# Patient Record
Sex: Male | Born: 1955 | Race: Black or African American | Hispanic: No | Marital: Married | Smoking: Never smoker
Health system: Southern US, Community
[De-identification: ages and names within clinical notes are randomized; demographics above are authoritative.]

## PROBLEM LIST (undated history)

## (undated) DIAGNOSIS — K319 Disease of stomach and duodenum, unspecified: Secondary | ICD-10-CM

## (undated) DIAGNOSIS — I10 Essential (primary) hypertension: Secondary | ICD-10-CM

## (undated) DIAGNOSIS — E785 Hyperlipidemia, unspecified: Secondary | ICD-10-CM

## (undated) HISTORY — DX: Disease of stomach and duodenum, unspecified: K31.9

## (undated) HISTORY — PX: UPPER GASTROINTESTINAL ENDOSCOPY: SHX188

## (undated) HISTORY — PX: ESOPHAGOGASTRODUODENOSCOPY: SHX1529

## (undated) HISTORY — DX: Hyperlipidemia, unspecified: E78.5

---

## 1998-12-23 ENCOUNTER — Emergency Department (HOSPITAL_COMMUNITY): Admission: EM | Admit: 1998-12-23 | Discharge: 1998-12-23 | Payer: Self-pay | Admitting: Emergency Medicine

## 2000-08-04 ENCOUNTER — Encounter: Admission: RE | Admit: 2000-08-04 | Discharge: 2000-08-04 | Payer: Self-pay | Admitting: Family Medicine

## 2000-08-04 ENCOUNTER — Encounter: Payer: Self-pay | Admitting: Family Medicine

## 2001-03-29 ENCOUNTER — Encounter: Admission: RE | Admit: 2001-03-29 | Discharge: 2001-03-29 | Payer: Self-pay | Admitting: *Deleted

## 2001-03-29 ENCOUNTER — Encounter: Payer: Self-pay | Admitting: *Deleted

## 2005-09-07 ENCOUNTER — Emergency Department (HOSPITAL_COMMUNITY): Admission: EM | Admit: 2005-09-07 | Discharge: 2005-09-07 | Payer: Self-pay | Admitting: Emergency Medicine

## 2008-12-02 ENCOUNTER — Encounter: Admission: RE | Admit: 2008-12-02 | Discharge: 2008-12-02 | Payer: Self-pay | Admitting: Internal Medicine

## 2011-02-26 ENCOUNTER — Encounter: Payer: Self-pay | Admitting: *Deleted

## 2011-02-26 ENCOUNTER — Emergency Department (INDEPENDENT_AMBULATORY_CARE_PROVIDER_SITE_OTHER): Payer: No Typology Code available for payment source

## 2011-02-26 ENCOUNTER — Emergency Department (HOSPITAL_BASED_OUTPATIENT_CLINIC_OR_DEPARTMENT_OTHER)
Admission: EM | Admit: 2011-02-26 | Discharge: 2011-02-26 | Disposition: A | Discharge status: Home / Self Care | Payer: No Typology Code available for payment source | Attending: Emergency Medicine | Admitting: Emergency Medicine

## 2011-02-26 DIAGNOSIS — S39012A Strain of muscle, fascia and tendon of lower back, initial encounter: Secondary | ICD-10-CM

## 2011-02-26 DIAGNOSIS — M542 Cervicalgia: Secondary | ICD-10-CM

## 2011-02-26 DIAGNOSIS — M545 Low back pain: Secondary | ICD-10-CM

## 2011-02-26 DIAGNOSIS — Y92410 Unspecified street and highway as the place of occurrence of the external cause: Secondary | ICD-10-CM | POA: Insufficient documentation

## 2011-02-26 DIAGNOSIS — S335XXA Sprain of ligaments of lumbar spine, initial encounter: Secondary | ICD-10-CM | POA: Insufficient documentation

## 2011-02-26 DIAGNOSIS — I10 Essential (primary) hypertension: Secondary | ICD-10-CM | POA: Insufficient documentation

## 2011-02-26 HISTORY — DX: Essential (primary) hypertension: I10

## 2011-02-26 MED ORDER — HYDROCODONE-ACETAMINOPHEN 5-325 MG PO TABS: 1.0000 | ORAL_TABLET | Freq: Four times a day (QID) | ORAL | Status: AC | PRN

## 2011-02-26 MED ORDER — CYCLOBENZAPRINE HCL 5 MG PO TABS: 5.0000 mg | ORAL_TABLET | Freq: Three times a day (TID) | ORAL | Status: AC | PRN
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
## 2011-09-21 ENCOUNTER — Encounter: Payer: Self-pay | Admitting: Internal Medicine

## 2011-12-05 ENCOUNTER — Encounter: Payer: BC Managed Care – PPO | Admitting: Internal Medicine

## 2013-03-19 ENCOUNTER — Encounter: Payer: Self-pay | Admitting: Internal Medicine

## 2013-05-23 ENCOUNTER — Encounter: Payer: BC Managed Care – PPO | Admitting: Internal Medicine

## 2013-07-30 ENCOUNTER — Encounter: Payer: Self-pay | Admitting: Family Medicine

## 2013-07-30 ENCOUNTER — Telehealth: Payer: Self-pay | Admitting: Family Medicine

## 2013-07-30 ENCOUNTER — Encounter: Payer: Self-pay | Admitting: Internal Medicine

## 2013-08-09 NOTE — Telephone Encounter (Signed)
error 

## 2014-04-11 HISTORY — PX: COLONOSCOPY: SHX174

## 2014-04-28 ENCOUNTER — Encounter: Payer: Self-pay | Admitting: Gastroenterology

## 2014-05-22 ENCOUNTER — Telehealth: Payer: Self-pay | Admitting: Internal Medicine

## 2014-05-22 NOTE — Telephone Encounter (Signed)
Received records from [facility] (Dr Bernerd Limbo) for appointment on 06/13/14 with Dr Debara Pickett.  Records given to [facility] (medical records) for Dr Lysbeth Penner schedule on 06/13/14. lp

## 2014-06-11 ENCOUNTER — Ambulatory Visit (AMBULATORY_SURGERY_CENTER): Payer: Self-pay

## 2014-06-11 VITALS — Ht 70.5 in | Wt 265.0 lb

## 2014-06-11 DIAGNOSIS — Z1211 Encounter for screening for malignant neoplasm of colon: Secondary | ICD-10-CM

## 2014-06-11 MED ORDER — SUPREP BOWEL PREP KIT 17.5-3.13-1.6 GM/177ML PO SOLN: 1.0000 | Freq: Once | ORAL | Status: DC

## 2014-06-11 NOTE — Progress Notes (Signed)
No allergies to eggs or soy No past problems with anesthesia No home oxygen No diet/weight loss meds  Has email  Emmi instructions given for colonoscopy 

## 2014-06-13 ENCOUNTER — Encounter: Payer: Self-pay | Admitting: Internal Medicine

## 2014-06-13 ENCOUNTER — Ambulatory Visit (INDEPENDENT_AMBULATORY_CARE_PROVIDER_SITE_OTHER): Payer: BC Managed Care – PPO | Admitting: Internal Medicine

## 2014-06-13 VITALS — BP 168/118 | HR 52 | Ht 70.5 in | Wt 266.9 lb

## 2014-06-13 DIAGNOSIS — R0789 Other chest pain: Secondary | ICD-10-CM

## 2014-06-13 DIAGNOSIS — I10 Essential (primary) hypertension: Secondary | ICD-10-CM

## 2014-06-13 DIAGNOSIS — E785 Hyperlipidemia, unspecified: Secondary | ICD-10-CM

## 2014-06-13 NOTE — Patient Instructions (Signed)
Your physician recommends that you schedule a follow-up appointment as needed  
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
## 2014-06-25 ENCOUNTER — Ambulatory Visit (AMBULATORY_SURGERY_CENTER): Payer: BC Managed Care – PPO | Admitting: Gastroenterology

## 2014-06-25 ENCOUNTER — Encounter: Payer: Self-pay | Admitting: Gastroenterology

## 2014-06-25 VITALS — BP 161/88 | HR 56 | Temp 97.9°F | Resp 16 | Ht 70.0 in | Wt 265.0 lb

## 2014-06-25 DIAGNOSIS — D122 Benign neoplasm of ascending colon: Secondary | ICD-10-CM

## 2014-06-25 DIAGNOSIS — D124 Benign neoplasm of descending colon: Secondary | ICD-10-CM | POA: Diagnosis not present

## 2014-06-25 DIAGNOSIS — Z1211 Encounter for screening for malignant neoplasm of colon: Secondary | ICD-10-CM

## 2014-06-25 DIAGNOSIS — K5730 Diverticulosis of large intestine without perforation or abscess without bleeding: Secondary | ICD-10-CM

## 2014-06-25 MED ORDER — SODIUM CHLORIDE 0.9 % IV SOLN: 500.0000 mL | INTRAVENOUS | Status: DC

## 2014-06-25 NOTE — Progress Notes (Signed)
Called to room to assist during endoscopic procedure.  Patient ID and intended procedure confirmed with present staff. Received instructions for my participation in the procedure from the performing physician.  

## 2014-06-25 NOTE — Op Note (Signed)
[place]  [address] [place], [id number]   COLONOSCOPY PROCEDURE REPORT  PATIENT: Ronnie Salinas, Ronnie Salinas  MR#: [id number] BIRTHDATE: 1956-01-29 , 33  yrs. old GENDER: male ENDOSCOPIST: Inda Castle, MD REFERRED RN:HAFBX Bouska, M.D. PROCEDURE DATE:  06/25/2014 PROCEDURE:   Colonoscopy with cold biopsy polypectomy, Colonoscopy with snare polypectomy, and Colonoscopy, screening First Screening Colonoscopy - Avg.  risk and is 50 yrs.  old or older Yes.  Prior Negative Screening - Now for repeat screening. N/A  History of Adenoma - Now for follow-up colonoscopy & has been > or = to 3 yrs.  N/A ASA CLASS:   Class II INDICATIONS:Colorectal Neoplasm Risk Assessment for this procedure is average risk. MEDICATIONS: Monitored anesthesia care and Propofol 200 mg IV  DESCRIPTION OF PROCEDURE:   After the risks benefits and alternatives of the procedure were thoroughly explained, informed consent was obtained.  The digital rectal exam revealed no abnormalities of the rectum.   The LB [id number]  endoscope was introduced through the anus and advanced to the cecum, which was identified by both the appendix and ileocecal valve. No adverse events experienced.   The quality of the prep was (Suprep was used) excellent.  The instrument was then slowly withdrawn as the colon was fully examined.      COLON FINDINGS: A sessile polyp measuring 3 mm in size was found in the ascending colon.  A polypectomy was performed with a cold snare.  The resection was complete, the polyp tissue was completely retrieved and sent to histology.   There was mild diverticulosis noted in the ascending colon.   A sessile polyp measuring 2 mm in size was found in the descending colon.  A polypectomy was performed with cold forceps.  Retroflexed views revealed no abnormalities. The time to cecum = 2.6 Withdrawal time = 13.5   The scope was withdrawn and the procedure completed. COMPLICATIONS:  There were no immediate complications.  ENDOSCOPIC IMPRESSION: 1.   Sessile polyp was found in the ascending colon; polypectomy was performed with a cold snare 2.   Mild diverticulosis was noted in the ascending colon 3.   Sessile polyp was found in the descending colon; polypectomy was performed with cold forceps  RECOMMENDATIONS: If the polyp(s) removed today are proven to be adenomatous (pre-cancerous) polyps, you will need a repeat colonoscopy in 5 years.  Otherwise you should continue to follow colorectal cancer screening guidelines for "routine risk" patients with colonoscopy in 10 years.  You will receive a letter within 1-2 weeks with the results of your biopsy as well as final recommendations.  Please call my office if you have not received a letter after 3 weeks.  eSigned:  Inda Castle, MD 06/25/2014 10:57 AM   cc:   PATIENT NAME:  Ronnie Salinas, Ronnie Salinas MR#: [id number]

## 2014-06-25 NOTE — Patient Instructions (Signed)
YOU HAD AN ENDOSCOPIC PROCEDURE TODAY AT THE [facility]:   Refer to the procedure report that was given to you for any specific questions about what was found during the examination.  If the procedure report does not answer your questions, please call your gastroenterologist to clarify.  If you requested that your care partner not be given the details of your procedure findings, then the procedure report has been included in a sealed envelope for you to review at your convenience later.  YOU SHOULD EXPECT: Some feelings of bloating in the abdomen. Passage of more gas than usual.  Walking can help get rid of the air that was put into your GI tract during the procedure and reduce the bloating. If you had a lower endoscopy (such as a colonoscopy or flexible sigmoidoscopy) you may notice spotting of blood in your stool or on the toilet paper. If you underwent a bowel prep for your procedure, you may not have a normal bowel movement for a few days.  Please Note:  You might notice some irritation and congestion in your nose or some drainage.  This is from the oxygen used during your procedure.  There is no need for concern and it should clear up in a day or so.  SYMPTOMS TO REPORT IMMEDIATELY:   Following lower endoscopy (colonoscopy or flexible sigmoidoscopy):  Excessive amounts of blood in the stool  Significant tenderness or worsening of abdominal pains  Swelling of the abdomen that is new, acute  Fever of 100F or higher   For urgent or emergent issues, a gastroenterologist can be reached at any hour by calling [phone].   DIET: Your first meal following the procedure should be a small meal and then it is ok to progress to your normal diet. Heavy or fried foods are harder to digest and may make you feel nauseous or bloated.  Likewise, meals heavy in dairy and vegetables can increase bloating.  Drink plenty of fluids but you should avoid alcoholic beverages for 24  hours.  ACTIVITY:  You should plan to take it easy for the rest of today and you should NOT DRIVE or use heavy machinery until tomorrow (because of the sedation medicines used during the test).    FOLLOW UP: Our staff will call the number listed on your records the next business day following your procedure to check on you and address any questions or concerns that you may have regarding the information given to you following your procedure. If we do not reach you, we will leave a message.  However, if you are feeling well and you are not experiencing any problems, there is no need to return our call.  We will assume that you have returned to your regular daily activities without incident.  If any biopsies were taken you will be contacted by phone or by letter within the next 1-3 weeks.  Please call us at [phone] if you have not heard about the biopsies in 3 weeks.    SIGNATURES/CONFIDENTIALITY: You and/or your care partner have signed paperwork which will be entered into your electronic medical record.  These signatures attest to the fact that that the information above on your After Visit Summary has been reviewed and is understood.  Full responsibility of the confidentiality of this discharge information lies with you and/or your care-partner.  Recommendations Next colonoscopy determined by pathology results; 5 or 10 years. Discharge instructions given to patient and/or care partner. Polyp, diverticulosis, and high fiber   diet handouts provided.  

## 2014-06-26 ENCOUNTER — Telehealth: Payer: Self-pay

## 2014-06-26 NOTE — Telephone Encounter (Signed)
No answer, left message

## 2014-07-01 ENCOUNTER — Encounter: Payer: Self-pay | Admitting: Gastroenterology

## 2014-07-02 ENCOUNTER — Telehealth: Payer: Self-pay | Admitting: Gastroenterology

## 2014-07-02 NOTE — Telephone Encounter (Signed)
Patient vomited once on Sunday. Then he started having watery bowel movements. He is have "20" stools a day. Everything he eats goes straight through him. Appetite is good. No further nausea. No fevers. Please advise.

## 2014-07-02 NOTE — Telephone Encounter (Signed)
Probable gastroenteritis.  Make sure that he maintains his fluids.  Call back later in week if he still is ill

## 2014-07-02 NOTE — Telephone Encounter (Signed)
Patient instructed on clear liquids and to advance as tolerated to maintain hydration. May use OTC supportive care such as Imodium and baby diaper wipes.

## 2019-08-13 ENCOUNTER — Encounter: Payer: BC Managed Care – PPO | Admitting: Gastroenterology

## 2019-08-13 ENCOUNTER — Ambulatory Visit (AMBULATORY_SURGERY_CENTER): Payer: Self-pay

## 2019-08-13 ENCOUNTER — Other Ambulatory Visit: Payer: Self-pay

## 2019-08-13 VITALS — Temp 97.6°F | Ht 70.0 in | Wt 257.0 lb

## 2019-08-13 DIAGNOSIS — Z86010 Personal history of colonic polyps: Secondary | ICD-10-CM

## 2019-08-13 MED ORDER — CLENPIQ 10-3.5-12 MG-GM -GM/160ML PO SOLN: 1.0000 | Freq: Once | ORAL | 0 refills | Status: AC

## 2019-08-13 NOTE — Progress Notes (Signed)
No egg or soy allergy known to patient  No issues with past sedation with any surgeries  or procedures, no intubation problems  No diet pills per patient No home 02 use per patient  No blood thinners per patient  Pt denies issues with constipation  No A fib or A flutter  EMMI video sent to pt's e mail   Pt has completed his covid vaccine series.  clenpiq coupon given  Due to the COVID-19 pandemic we are asking patients to follow these guidelines. Please only bring one care partner. Please be aware that your care partner may wait in the car in the parking lot or if they feel like they will be too hot to wait in the car, they may wait in the lobby on the 4th floor. All care partners are required to wear a mask the entire time (we do not have any that we can provide them), they need to practice social distancing, and we will do a Covid check for all patient's and care partners when you arrive. Also we will check their temperature and your temperature. If the care partner waits in their car they need to stay in the parking lot the entire time and we will call them on their cell phone when the patient is ready for discharge so they can bring the car to the front of the building. Also all patient's will need to wear a mask into building.

## 2019-08-21 ENCOUNTER — Encounter: Payer: Self-pay | Admitting: Gastroenterology

## 2019-08-23 ENCOUNTER — Encounter: Payer: Self-pay | Admitting: Gastroenterology

## 2019-08-23 ENCOUNTER — Other Ambulatory Visit: Payer: Self-pay

## 2019-08-23 ENCOUNTER — Ambulatory Visit (AMBULATORY_SURGERY_CENTER): Payer: BC Managed Care – PPO | Admitting: Gastroenterology

## 2019-08-23 VITALS — BP 147/88 | HR 47 | Temp 97.1°F | Resp 21 | Ht 70.0 in | Wt 257.0 lb

## 2019-08-23 DIAGNOSIS — K635 Polyp of colon: Secondary | ICD-10-CM

## 2019-08-23 DIAGNOSIS — K640 First degree hemorrhoids: Secondary | ICD-10-CM

## 2019-08-23 DIAGNOSIS — K639 Disease of intestine, unspecified: Secondary | ICD-10-CM | POA: Diagnosis not present

## 2019-08-23 DIAGNOSIS — K5730 Diverticulosis of large intestine without perforation or abscess without bleeding: Secondary | ICD-10-CM

## 2019-08-23 DIAGNOSIS — Z86010 Personal history of colonic polyps: Secondary | ICD-10-CM

## 2019-08-23 DIAGNOSIS — D122 Benign neoplasm of ascending colon: Secondary | ICD-10-CM

## 2019-08-23 MED ORDER — SODIUM CHLORIDE 0.9 % IV SOLN
500.0000 mL | Freq: Once | INTRAVENOUS | Status: DC
Start: 2019-08-23 — End: 2019-08-23

## 2019-08-23 NOTE — Op Note (Addendum)
[facility] Patient Name: Ronnie Salinas Procedure Date: 08/23/2019 11:44 AM MRN: [id number] Endoscopist: Gerrit Heck , MD Age: 65 Referring MD:  Date of Birth: Dec 16, 1955 Gender: Male Account #: [id number] Procedure:                Colonoscopy Indications:              Surveillance: Personal history of adenomatous                            polyps on last colonoscopy 5 years ago                           - Colonoscopy in 06/2014 with 2 small (2-3 mm)                            tubular adenomas, with recommendation to repeat in                            5 years. Otherwise, no active GI symptoms. Medicines:                Monitored Anesthesia Care Procedure:                Pre-Anesthesia Assessment:                           - Prior to the procedure, a History and Physical                            was performed, and patient medications and                            allergies were reviewed. The patient's tolerance of                            previous anesthesia was also reviewed. The risks                            and benefits of the procedure and the sedation                            options and risks were discussed with the patient.                            All questions were answered, and informed consent                            was obtained. Prior Anticoagulants: The patient has                            taken no previous anticoagulant or antiplatelet                            agents. ASA Grade Assessment: II - A patient with  mild systemic disease. After reviewing the risks                            and benefits, the patient was deemed in                            satisfactory condition to undergo the procedure.                           After obtaining informed consent, the colonoscope                            was passed under direct vision. Throughout the                            procedure, the patient's blood pressure,  pulse, and                            oxygen saturations were monitored continuously. The                            Colonoscope was introduced through the anus and                            advanced to the the terminal ileum. The colonoscopy                            was performed without difficulty. The patient                            tolerated the procedure well. The quality of the                            bowel preparation was excellent. The terminal                            ileum, ileocecal valve, appendiceal orifice, and                            rectum were photographed. Scope In: 11:54:02 AM Scope Out: 12:08:57 PM Scope Withdrawal Time: 0 hours 12 minutes 36 seconds  Total Procedure Duration: 0 hours 14 minutes 55 seconds  Findings:                 The perianal and digital rectal examinations were                            normal.                           A 4 mm polyp was found in the ascending colon. The                            polyp was sessile. The polyp was removed with a  cold snare. Resection and retrieval were complete.                            Estimated blood loss was minimal.                           A few small-mouthed diverticula were found in the                            sigmoid colon.                           Non-bleeding internal hemorrhoids were found during                            retroflexion. The hemorrhoids were small.                           The exam was otherwise normal throughout the                            remainder of the colon.                           There was a submucosal nodule, approximately 8 mm                            in size, in the terminal ileum, located 5 cm from                            the ileocecal valve. The lesion was firm and                            semi-mobile. Tunnelled biopsies were taken with a                            cold forceps for histology. Estimated blood loss                             was minimal. Complications:            No immediate complications. Estimated Blood Loss:     Estimated blood loss was minimal. Impression:               - One 4 mm polyp in the ascending colon, removed                            with a cold snare. Resected and retrieved.                           - Diverticulosis in the sigmoid colon.                           - Non-bleeding internal hemorrhoids.                           -  Subepithelial nodule located in the distal ileum,                            located approximately 5 cm from the ileocecal                            valve. Biopsied. Recommendation:           - Patient has a contact number available for                            emergencies. The signs and symptoms of potential                            delayed complications were discussed with the                            patient. Return to normal activities tomorrow.                            Written discharge instructions were provided to the                            patient.                           - Resume previous diet.                           - Continue present medications.                           - Await pathology results.                           - Repeat colonoscopy for surveillance based on                            pathology results.                           - Return to GI office at appointment to be                            scheduled.                           - Will discuss whether further evaluation is needed                            for the ileal nodule based on pathology. Gerrit Heck, MD 08/23/2019 12:21:15 PM

## 2019-08-23 NOTE — Patient Instructions (Signed)
Handouts Provided:  Polyps, Diverticulosis, and Hemorrhoids  YOU HAD AN ENDOSCOPIC PROCEDURE TODAY AT THE [facility]:   Refer to the procedure report that was given to you for any specific questions about what was found during the examination.  If the procedure report does not answer your questions, please call your gastroenterologist to clarify.  If you requested that your care partner not be given the details of your procedure findings, then the procedure report has been included in a sealed envelope for you to review at your convenience later.  YOU SHOULD EXPECT: Some feelings of bloating in the abdomen. Passage of more gas than usual.  Walking can help get rid of the air that was put into your GI tract during the procedure and reduce the bloating. If you had a lower endoscopy (such as a colonoscopy or flexible sigmoidoscopy) you may notice spotting of blood in your stool or on the toilet paper. If you underwent a bowel prep for your procedure, you may not have a normal bowel movement for a few days.  Please Note:  You might notice some irritation and congestion in your nose or some drainage.  This is from the oxygen used during your procedure.  There is no need for concern and it should clear up in a day or so.  SYMPTOMS TO REPORT IMMEDIATELY:   Following lower endoscopy (colonoscopy or flexible sigmoidoscopy):  Excessive amounts of blood in the stool  Significant tenderness or worsening of abdominal pains  Swelling of the abdomen that is new, acute  Fever of 100F or higher   For urgent or emergent issues, a gastroenterologist can be reached at any hour by calling [phone]. Do not use MyChart messaging for urgent concerns.    DIET:  We do recommend a small meal at first, but then you may proceed to your regular diet.  Drink plenty of fluids but you should avoid alcoholic beverages for 24 hours.  ACTIVITY:  You should plan to take it easy for the rest of today and you  should NOT DRIVE or use heavy machinery until tomorrow (because of the sedation medicines used during the test).    FOLLOW UP: Our staff will call the number listed on your records 48-72 hours following your procedure to check on you and address any questions or concerns that you may have regarding the information given to you following your procedure. If we do not reach you, we will leave a message.  We will attempt to reach you two times.  During this call, we will ask if you have developed any symptoms of COVID 19. If you develop any symptoms (ie: fever, flu-like symptoms, shortness of breath, cough etc.) before then, please call [phone].  If you test positive for Covid 19 in the 2 weeks post procedure, please call and report this information to us.    If any biopsies were taken you will be contacted by phone or by letter within the next 1-3 weeks.  Please call us at [phone] if you have not heard about the biopsies in 3 weeks.    SIGNATURES/CONFIDENTIALITY: You and/or your care partner have signed paperwork which will be entered into your electronic medical record.  These signatures attest to the fact that that the information above on your After Visit Summary has been reviewed and is understood.  Full responsibility of the confidentiality of this discharge information lies with you and/or your care-partner.  

## 2019-08-23 NOTE — Progress Notes (Signed)
Called to room to assist during endoscopic procedure.  Patient ID and intended procedure confirmed with present staff. Received instructions for my participation in the procedure from the performing physician.  

## 2019-08-23 NOTE — Progress Notes (Signed)
KA- vitals LS- temp

## 2019-08-23 NOTE — Progress Notes (Signed)
Report to PACU, RN, vss, BBS= Clear.  

## 2019-08-23 NOTE — Progress Notes (Signed)
Pt's states no medical or surgical changes since previsit or office visit. 

## 2019-08-27 ENCOUNTER — Telehealth: Payer: Self-pay

## 2019-08-27 NOTE — Telephone Encounter (Signed)
1st follow up call made.  NALM 

## 2019-08-27 NOTE — Telephone Encounter (Signed)
2nd follow up call made.  NALM 

## 2019-08-29 ENCOUNTER — Encounter: Payer: Self-pay | Admitting: Gastroenterology

## 2019-09-25 ENCOUNTER — Other Ambulatory Visit: Payer: Self-pay | Admitting: Gastroenterology

## 2019-09-25 ENCOUNTER — Telehealth: Payer: Self-pay

## 2019-09-25 DIAGNOSIS — K639 Disease of intestine, unspecified: Secondary | ICD-10-CM

## 2019-09-25 NOTE — Telephone Encounter (Signed)
-----   Message from Lavena Bullion, DO sent at 09/24/2019 10:52 AM EDT ----- Can you please get a follow-up appt for this patient? He has a small nodule in the distal small bowel seen on colonoscopy. The biopsies were benign, but I think this needs to evaluated further with CT abdomen/pelvis with PO/IV contrast (I wrote all that in the biopsy letter to him). Would be ok to do CT first, then have f/u appt to discuss results and to discuss when repeat colonoscopy needed. Thanks.

## 2019-09-25 NOTE — Telephone Encounter (Signed)
Spoke to patient who is scheduled for a CT then will follow up in office to discuss results. He will come by the office to pick up contrast then have his labs drawn for the CT at that time. All questions answered. Patient voiced understanding.

## 2019-10-23 ENCOUNTER — Ambulatory Visit (HOSPITAL_BASED_OUTPATIENT_CLINIC_OR_DEPARTMENT_OTHER): Admission: RE | Admit: 2019-10-23 | Payer: BC Managed Care – PPO | Source: Ambulatory Visit

## 2019-10-23 ENCOUNTER — Other Ambulatory Visit: Payer: Self-pay

## 2019-10-23 DIAGNOSIS — K639 Disease of intestine, unspecified: Secondary | ICD-10-CM

## 2019-10-24 LAB — BUN+CREAT
BUN/Creatinine Ratio: 13 (ref 10–24)
BUN: 15 mg/dL (ref 8–27)
Creatinine, Ser: 1.2 mg/dL (ref 0.76–1.27)
GFR calc Af Amer: 75 mL/min/{1.73_m2} (ref >59)
GFR calc non Af Amer: 65 mL/min/{1.73_m2} (ref >59)

## 2019-10-31 ENCOUNTER — Other Ambulatory Visit: Payer: Self-pay

## 2019-10-31 ENCOUNTER — Encounter (HOSPITAL_BASED_OUTPATIENT_CLINIC_OR_DEPARTMENT_OTHER): Payer: Self-pay

## 2019-10-31 ENCOUNTER — Ambulatory Visit (HOSPITAL_BASED_OUTPATIENT_CLINIC_OR_DEPARTMENT_OTHER)
Admission: RE | Admit: 2019-10-31 | Discharge: 2019-10-31 | Disposition: A | Discharge status: Home / Self Care | Payer: BC Managed Care – PPO | Source: Ambulatory Visit | Attending: Gastroenterology | Admitting: Gastroenterology

## 2019-10-31 DIAGNOSIS — K639 Disease of intestine, unspecified: Secondary | ICD-10-CM | POA: Diagnosis not present

## 2019-10-31 MED ORDER — IOHEXOL 300 MG/ML  SOLN
100.0000 mL | Freq: Once | INTRAMUSCULAR | Status: AC | PRN
  Administered 2019-10-31: 100 mL via INTRAVENOUS

## 2019-11-19 ENCOUNTER — Telehealth: Payer: Self-pay | Admitting: Gastroenterology

## 2019-11-19 NOTE — Telephone Encounter (Signed)
Spoke to patient who has agreed to keep his appointment on 11/21/19

## 2019-11-21 ENCOUNTER — Ambulatory Visit: Payer: BC Managed Care – PPO | Admitting: Gastroenterology

## 2021-08-18 IMAGING — CT CT ABD-PELV W/ CM
2 of 8 series · 15 of 46 positions shown, 17 images · IV contrast (Omnipaque)
Comparison: None

CLINICAL DATA: Patient reports small nodule and distal small bowel
noted on colonoscopy. Biopsies of nodule were benign.

EXAM:
CT ABDOMEN AND PELVIS WITH CONTRAST
TECHNIQUE: Multidetector CT imaging of the abdomen and pelvis was performed
using the standard protocol following bolus administration of
intravenous contrast.
CONTRAST:  100mL OMNIPAQUE IOHEXOL 300 MG/ML  SOLN

[Series 2: axial st · axial · 0.98mm/px · z∈[-518,-23]mm · 12 of 113 slices shown, 14 images]
[im 7/113  soft-tissue]
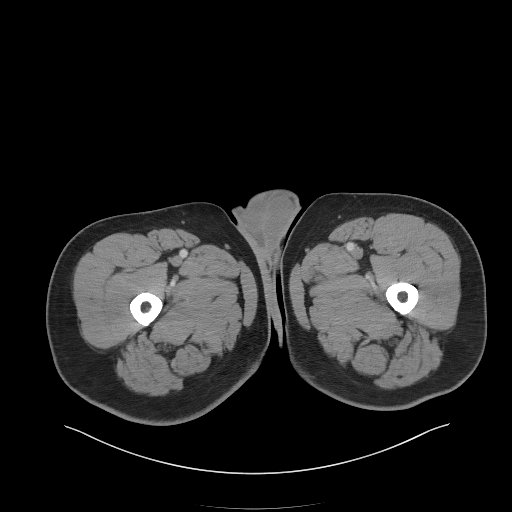
[im 7/113  bone]
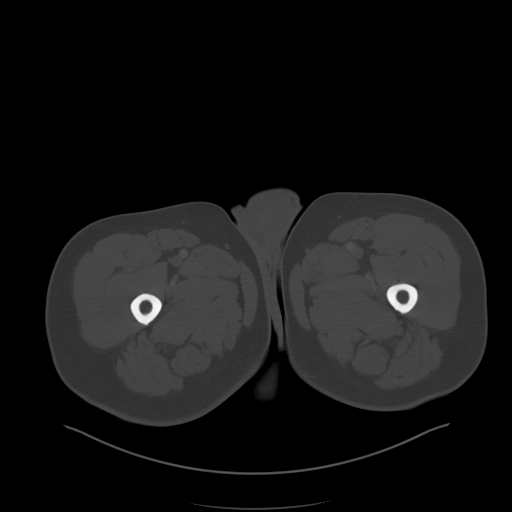
[im 19/113  soft-tissue]
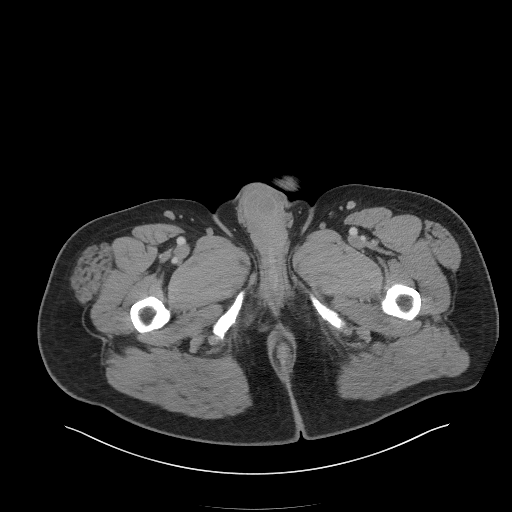
[im 25/113  soft-tissue]
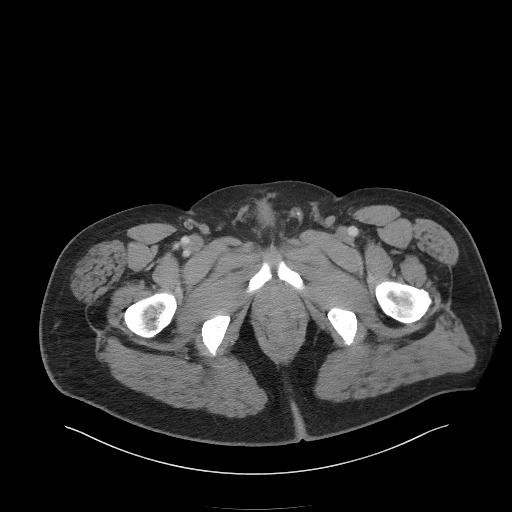
[im 32/113  soft-tissue]
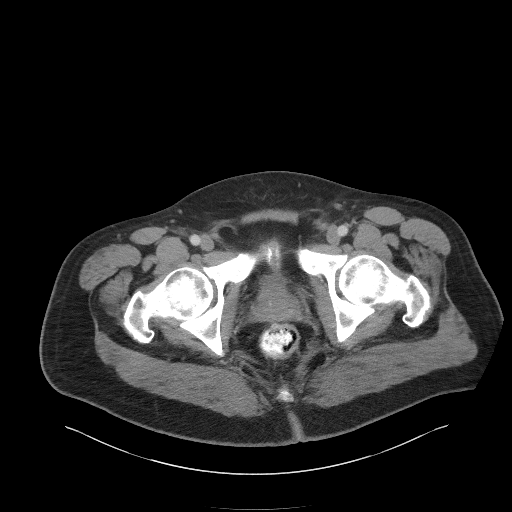
[im 44/113  soft-tissue]
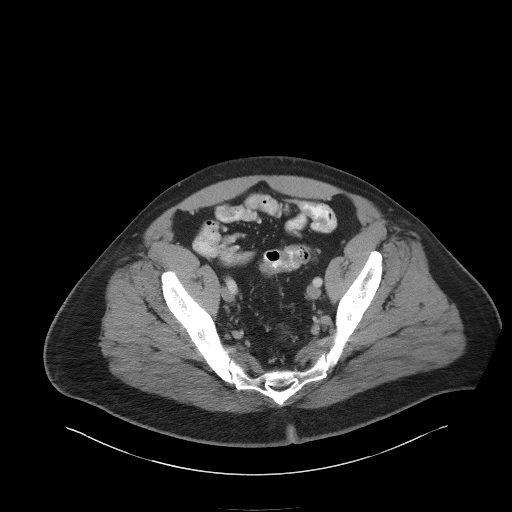
[im 50/113  soft-tissue]
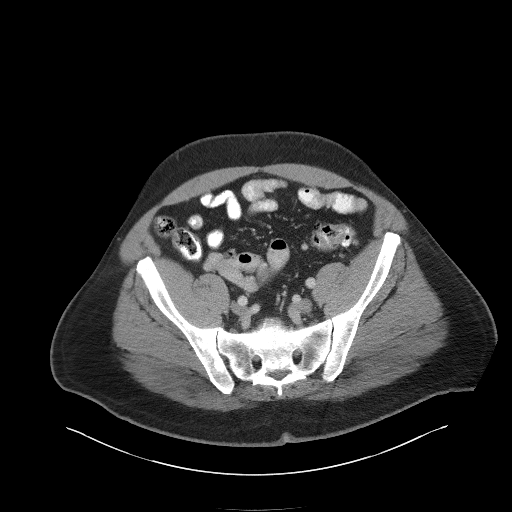
[im 63/113  soft-tissue]
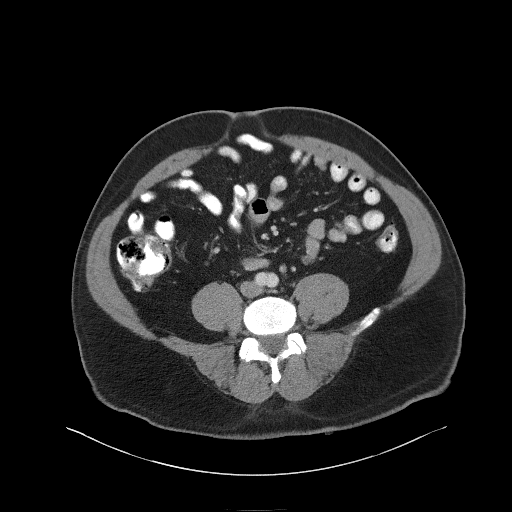
[im 69/113  soft-tissue]
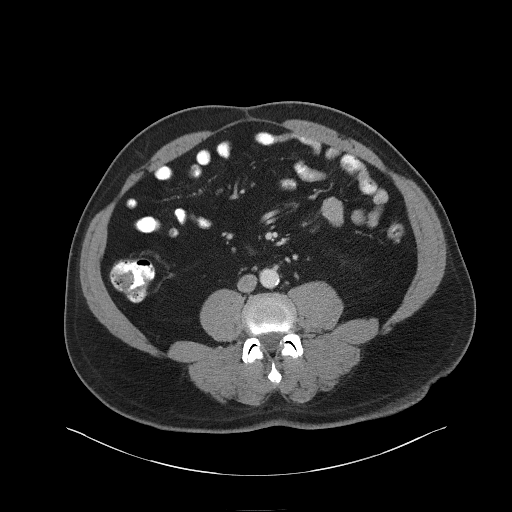
[im 81/113  soft-tissue]
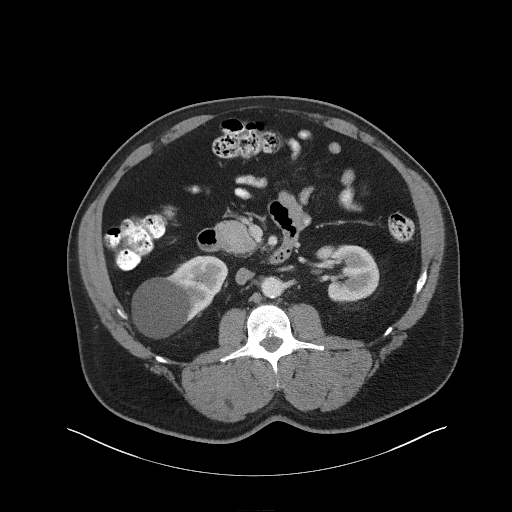
[im 81/113  bone]
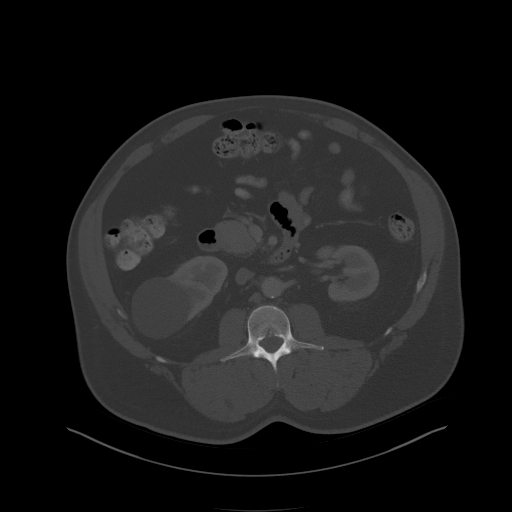
[im 88/113  soft-tissue]
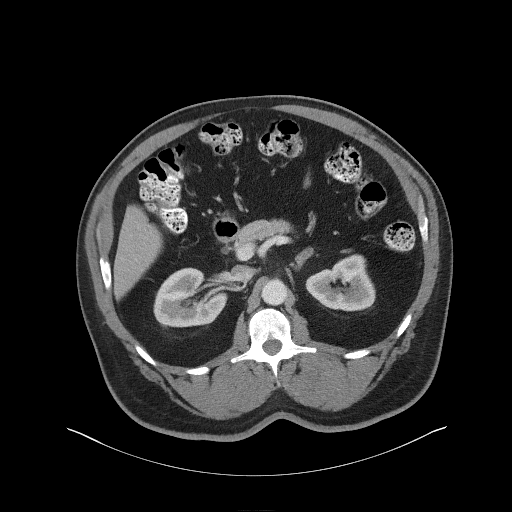
[im 94/113  soft-tissue]
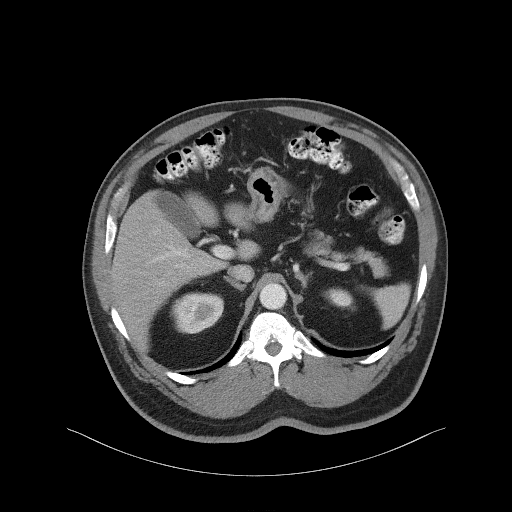
[im 106/113  soft-tissue]
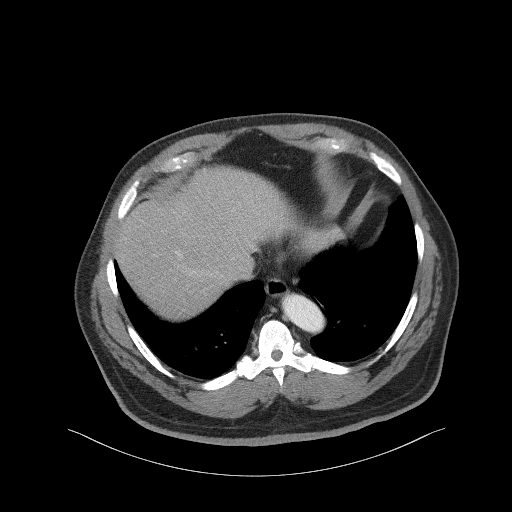

[Series 5: coronal st · coronal · 0.88mm/px · 3 of 117 slices shown]
[im 30/117  soft-tissue]
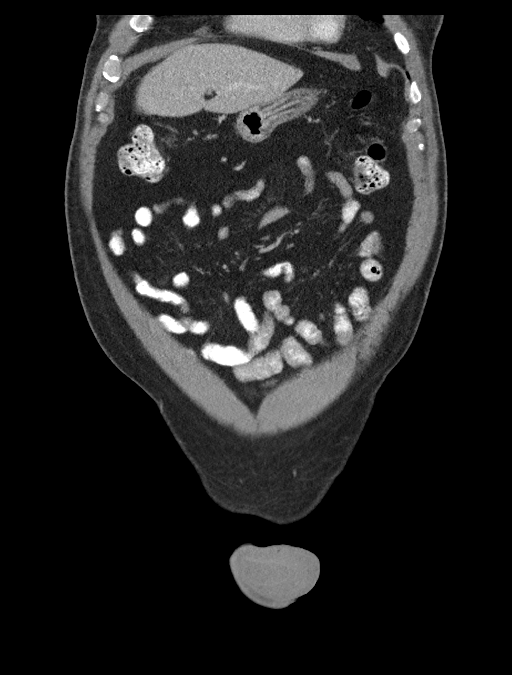
[im 59/117  soft-tissue]
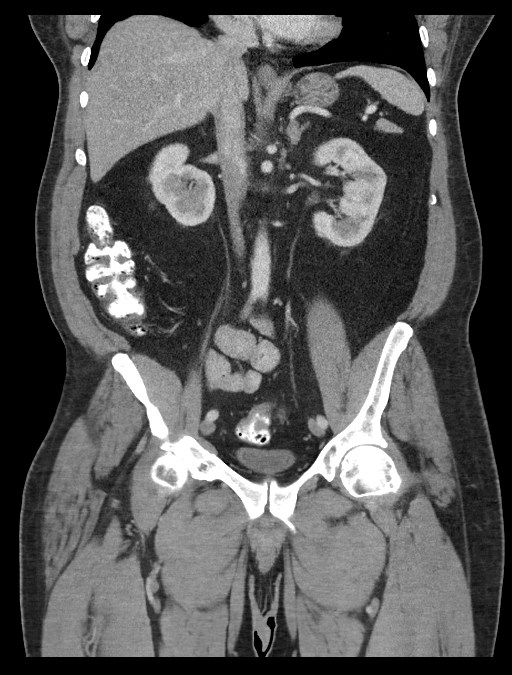
[im 88/117  soft-tissue]
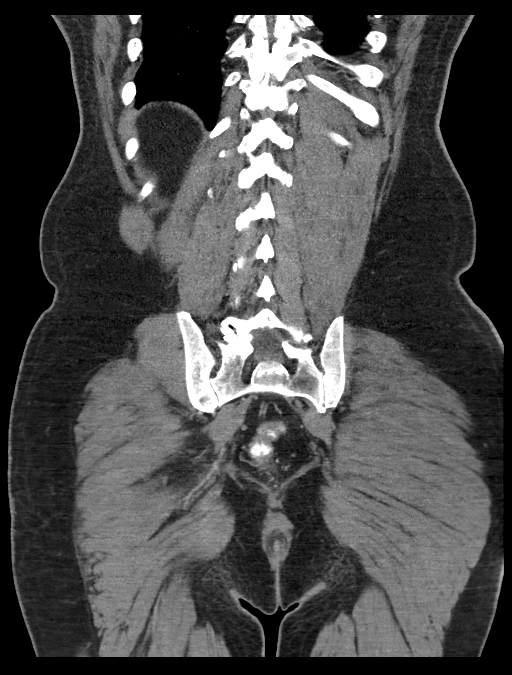

[15 of 46 positions shown; findings below may reference images not displayed]

FINDINGS: Lower chest: There is a subpleural nodule within the posterior right
lower lobe measures 4 mm, image [DATE]. No acute abnormality
identified.

Hepatobiliary: No focal liver abnormality is seen. No gallstones,
gallbladder wall thickening, or biliary dilatation.

Pancreas: Unremarkable. No pancreatic ductal dilatation or
surrounding inflammatory changes.

Spleen: Choose 1

Adrenals/Urinary Tract: Nodular hypertrophy of the adrenal glands
noted bilaterally.

No kidney stone or hydronephrosis identified bilaterally. There is a
cystic lesion arising from the inferior pole of the right kidney
measuring 5.5 by 6.0 cm, image 35/2. There is a peripheral soft
tissue attenuating nodule associated with this cyst measuring 1 cm,
image 60/5. Bladder appears collapsed.

Stomach/Bowel: Stomach appears normal. There is no bowel wall
thickening, inflammation, or distension. The appendix is visualized
and appears normal. Fat density mural nodule within the terminal
ileum is noted compatible with a small lipoma measuring 7 mm. Distal
colonic diverticulosis without acute inflammation.

Vascular/Lymphatic: Mild aortic atherosclerosis. There is no
abdominopelvic adenopathy.

Reproductive: Prostate is unremarkable.

Other: No free fluid or fluid collections. Ventral abdominal wall
umbilical hernia is identified which contains fat only measuring
cm in diameter.

Musculoskeletal: No acute or significant osseous findings.
IMPRESSION: 1. No acute findings identified within the abdomen or pelvis.
2. There is a cystic lesion arising from the inferior pole of the
right kidney. There is a peripheral soft tissue attenuating nodule
associated with this cyst measuring 1 cm. Further evaluation with
contrast enhanced MRI of the kidneys is advised.
3. Fat attenuating nodule within the terminal ileum is favored to
represent a benign lipoma.
4. 4 mm right lower lobe subpleural nodule. No follow-up needed if
patient is low-risk. Non-contrast chest CT can be considered in 12
months if patient is high-risk. This recommendation follows the
consensus statement: Guidelines for Management of Incidental
Pulmonary Nodules Detected on CT Images: From the [HOSPITAL]
5. Aortic atherosclerosis.
6. Ventral abdominal wall umbilical hernia contains fat only.

Aortic Atherosclerosis (BBILJ-2O1.1).

## 2022-03-16 ENCOUNTER — Encounter (HOSPITAL_BASED_OUTPATIENT_CLINIC_OR_DEPARTMENT_OTHER): Payer: Self-pay | Admitting: Emergency Medicine

## 2022-03-16 ENCOUNTER — Other Ambulatory Visit: Payer: Self-pay

## 2022-03-16 DIAGNOSIS — N2889 Other specified disorders of kidney and ureter: Secondary | ICD-10-CM | POA: Diagnosis not present

## 2022-03-16 DIAGNOSIS — Z7982 Long term (current) use of aspirin: Secondary | ICD-10-CM | POA: Diagnosis not present

## 2022-03-16 DIAGNOSIS — R1031 Right lower quadrant pain: Secondary | ICD-10-CM | POA: Diagnosis present

## 2022-03-16 DIAGNOSIS — Z79899 Other long term (current) drug therapy: Secondary | ICD-10-CM | POA: Diagnosis not present

## 2022-03-16 LAB — URINALYSIS, ROUTINE W REFLEX MICROSCOPIC
Bilirubin Urine: NEGATIVE
Glucose, UA: NEGATIVE mg/dL
Ketones, ur: NEGATIVE mg/dL
Nitrite: NEGATIVE
Protein, ur: NEGATIVE mg/dL
Specific Gravity, Urine: >=1.03 (ref 1.005–1.030)
pH: 5.5 (ref 5.0–8.0)

## 2022-03-16 LAB — URINALYSIS, MICROSCOPIC (REFLEX)

## 2022-03-16 NOTE — ED Triage Notes (Signed)
Pt c/o RT flank pain tonight; sts "it put me in the floor"; denies pain at this time

## 2022-03-17 ENCOUNTER — Emergency Department (HOSPITAL_BASED_OUTPATIENT_CLINIC_OR_DEPARTMENT_OTHER): Payer: No Typology Code available for payment source

## 2022-03-17 ENCOUNTER — Emergency Department (HOSPITAL_BASED_OUTPATIENT_CLINIC_OR_DEPARTMENT_OTHER)
Admission: EM | Admit: 2022-03-17 | Discharge: 2022-03-17 | Disposition: A | Discharge status: Home / Self Care | Payer: No Typology Code available for payment source | Attending: Emergency Medicine | Admitting: Emergency Medicine

## 2022-03-17 DIAGNOSIS — R109 Unspecified abdominal pain: Secondary | ICD-10-CM

## 2022-03-17 DIAGNOSIS — N289 Disorder of kidney and ureter, unspecified: Secondary | ICD-10-CM

## 2022-03-17 NOTE — ED Provider Notes (Signed)
[facility] Provider Note   CSN: [id number] Arrival date & time: 03/16/22  2011     History  Chief Complaint  Patient presents with   Flank Pain    Ronnie Salinas is a 66 y.o. male.   Flank Pain Pertinent negatives include no chest pain.  Patient with history of hypertension presents with right flank pain. He reports he had similar episode several days ago.  However earlier tonight while he was urinating he had sudden onset of sharp right-sided flank pain.  He reports it was very severe.  However by time he drove to the ER he started feeling better.  No fevers or vomiting.  No dysuria.  No other abdominal pain.  No chest pain.  He reports recent heavy lifting to cause the back strain but not similar to this pain   Past Medical History:  Diagnosis Date   Hyperlipidemia    Hypertension    Stomach problems     Home Medications Prior to Admission medications   Medication Sig Start Date End Date Taking? Authorizing Provider  Amlodipine-Valsartan-HCTZ 10-160-25 MG TABS Take by mouth.    [provider]  amLODIPine-Valsartan-HCTZ 10-320-25 MG TABS TAKE 1 TABLET BY MOUTH EVERY DAY 07/13/16   [provider]  aspirin 81 MG EC tablet TAKE 1 TABLET BY MOUTH EVERY DAY 07/04/16   [provider]  cloNIDine (CATAPRES) 0.1 MG tablet Take 0.1 mg by mouth 2 (two) times daily.      [provider]  Multiple Vitamins-Minerals (MENS 50+ MULTI VITAMIN/MIN) TABS Take by mouth.    [provider]  nebivolol (BYSTOLIC) 10 MG tablet Take 10 mg by mouth daily.    [provider]  potassium chloride (MICRO-K) 10 MEQ CR capsule Take 10 mEq by mouth 2 (two) times daily.      [provider]  spironolactone (ALDACTONE) 25 MG tablet Take 12.5 mg by mouth daily. 06/11/19   [provider]      Allergies    Patient has no known allergies.    Review of Systems   Review of Systems  Cardiovascular:   Negative for chest pain.  Genitourinary:  Positive for flank pain. Negative for dysuria.  Neurological:  Negative for weakness.    Physical Exam Updated Vital Signs BP (!) 144/94 (BP Location: Left Arm)   Pulse 60   Temp 98.2 F (36.8 C) (Oral)   Resp 20   Ht 1.791 m (5' 10.5")   Wt 113.4 kg   SpO2 99%   BMI 35.36 kg/m  Physical Exam CONSTITUTIONAL: Well developed/well nourished, patient sleeping in no distress HEAD: Normocephalic/atraumatic NECK: supple no meningeal signs SPINE/BACK:entire spine nontender No bruising/crepitance/stepoffs noted to spine CV: S1/S2 noted, no murmurs/rubs/gallops noted LUNGS: Lungs are clear to auscultation bilaterally, no apparent distress ABDOMEN: soft, nontender, no rebound or guarding, bowel sounds noted throughout abdomen GU:no cva tenderness No rash, no bruising or erythema noted to the flank NEURO: Pt is awake/alert/appropriate, moves all extremitiesx4.  No facial droop.   EXTREMITIES: pulses normal/equal, full ROM SKIN: warm, color normal PSYCH: no abnormalities of mood noted, alert and oriented to situation  ED Results / Procedures / Treatments   Labs (all labs ordered are listed, but only abnormal results are displayed) Labs Reviewed  URINALYSIS, ROUTINE W REFLEX MICROSCOPIC - Abnormal; Notable for the following components:      Result Value   Hgb urine dipstick TRACE (*)    Leukocytes,Ua TRACE (*)  All other components within normal limits  URINALYSIS, MICROSCOPIC (REFLEX) - Abnormal; Notable for the following components:   Bacteria, UA MANY (*)    All other components within normal limits  URINE CULTURE    EKG None  Radiology CT Renal Stone Study  Result Date: 03/17/2022 CLINICAL DATA:  Right flank pain, stone suspected. EXAM: CT ABDOMEN AND PELVIS WITHOUT CONTRAST TECHNIQUE: Multidetector CT imaging of the abdomen and pelvis was performed following the standard protocol without IV contrast. RADIATION DOSE REDUCTION:  This exam was performed according to the departmental dose-optimization program which includes automated exposure control, adjustment of the mA and/or kV according to patient size and/or use of iterative reconstruction technique. COMPARISON:  10/31/2019. FINDINGS: Lower chest: Mild atelectasis at the lung bases. Hepatobiliary: No focal liver abnormality is seen. No gallstones, gallbladder wall thickening, or biliary dilatation. Pancreas: Unremarkable. No pancreatic ductal dilatation or surrounding inflammatory changes. Spleen: Normal in size without focal abnormality. Adrenals/Urinary Tract: The adrenal glands are within normal limits. There is a cyst in the mid right kidney measuring 6.5 cm. An exophytic lesion is present in the mid to lower right kidney measuring 2.2 cm with indeterminate imaging characteristics. No renal calculus or hydronephrosis. The bladder is unremarkable. Stomach/Bowel: Stomach is within normal limits. Appendix appears normal. No evidence of bowel wall thickening, distention, or inflammatory changes. No free air or pneumatosis. Scattered diverticula are present along the colon without evidence of diverticulitis. Vascular/Lymphatic: Aortic atherosclerosis. No enlarged abdominal or pelvic lymph nodes. Reproductive: Prostate is unremarkable. Other: No abdominopelvic ascites. Fat containing inguinal hernias are noted bilaterally. There is a broad-based fat containing umbilical hernia. Musculoskeletal: Degenerative changes are present in the thoracolumbar spine. No acute osseous abnormality. IMPRESSION: 1. No renal calculus or obstructive uropathy bilaterally. 2. Complex exophytic lesion in the lower pole the right kidney measuring 2.2 cm, increased in size from the prior exam. MRI with contrast is suggested for further characterization to exclude the possibility of underlying neoplastic process. 3. Right renal cyst. 4. Colonic diverticulosis without diverticulitis. 5. Aortic atherosclerosis.  Electronically Signed   By: Brett Fairy M.D.   On: 03/17/2022 02:55    Procedures Procedures    Medications Ordered in ED Medications - No data to display  ED Course/ Medical Decision Making/ A&P Clinical Course as of 03/17/22 0315  Thu Mar 17, 2022  0315 Patient presents for right flank pain.  He is in no distress.  Suspect urine is contaminated, will send urine culture [DW]  0315 He is in no distress.  We discussed CT findings.  I advised him he needs close follow-up with his [place] specialist or with a urologist for MRI of his right kidney to rule out cancer.  Patient understands this.  He is otherwise safe for discharge [DW]    Clinical Course User Index [DW] Ripley Fraise, MD                           Medical Decision Making Amount and/or Complexity of Data Reviewed Labs: ordered. Radiology: ordered.   This patient presents to the ED for concern of flank pain, this involves an extensive number of treatment options, and is a complaint that carries with it a high risk of complications and morbidity.  The differential diagnosis includes but is not limited to pyelonephritis, ureteral stone, muscle strain, AAA, myelopathy  Comorbidities that complicate the patient evaluation: Patient's presentation is complicated by their history of hypertension   Additional history obtained: Additional  history obtained from spouse Records reviewed  previous CT scan results reviewed  Lab Tests: I Ordered, and personally interpreted labs.  The pertinent results include: Hematuria  Imaging Studies ordered: I ordered imaging studies including CT scan renal   I independently visualized and interpreted imaging which showed renal cyst/lesion I agree with the radiologist interpretation   Reevaluation: After the interventions noted above, I reevaluated the patient and found that they have :improved  Complexity of problems addressed: Patient's presentation is most consistent with  acute  presentation with potential threat to life or bodily function  Disposition: After consideration of the diagnostic results and the patient's response to treatment,  I feel that the patent would benefit from discharge   .           Final Clinical Impression(s) / ED Diagnoses Final diagnoses:  Flank pain  Renal lesion    Rx / DC Orders ED Discharge Orders     None         Ripley Fraise, MD 03/17/22 [phone]

## 2022-03-17 NOTE — Discharge Instructions (Signed)
As we discussed, you have a lesion/cyst on your right kidney.  This could be causing your pain.  Please call your [facility], or the urology specialist listed in the next week to have an outpatient MRI with contrast of your kidney.  This must be done to help rule out cancer in the kidney.  You can otherwise take over-the-counter medications for any pain.  SEEK IMMEDIATE MEDICAL ATTENTION IF: New numbness, tingling, weakness, or problem with the use of your arms or legs.  Severe back pain not relieved with medications.  Change in bowel or bladder control (if you lose control of stool or urine, or if you are unable to urinate) Increasing pain in any areas of the body (such as chest or abdominal pain).  Shortness of breath, dizziness or fainting.  Nausea (feeling sick to your stomach), vomiting, fever, or sweats.

## 2022-03-19 LAB — URINE CULTURE: Culture: >=100000 — AB

## 2022-03-20 ENCOUNTER — Telehealth (HOSPITAL_BASED_OUTPATIENT_CLINIC_OR_DEPARTMENT_OTHER): Payer: Self-pay | Admitting: *Deleted

## 2022-03-20 NOTE — Telephone Encounter (Signed)
Post ED Visit - Positive Culture Follow-up  Culture report reviewed by antimicrobial stewardship pharmacist: [facility] Team '[]'$  Elenor Quinones, Pharm.D. '[]'$  Heide Guile, Pharm.D., BCPS AQ-ID '[]'$  Parks Neptune, Pharm.D., BCPS '[]'$  Alycia Rossetti, Pharm.D., BCPS '[]'$  [place], Pharm.D., BCPS, AAHIVP '[]'$  Legrand Como, Pharm.D., BCPS, AAHIVP '[]'$  Salome Arnt, PharmD, BCPS '[]'$  Johnnette Gourd, PharmD, BCPS '[]'$  Hughes Better, PharmD, BCPS '[]'$  Leeroy Cha, PharmD '[]'$  Laqueta Linden, PharmD, BCPS '[x]'$  Rivka Barbara, PharmD  [place] Team '[]'$  Leodis Sias, PharmD '[]'$  Lindell Spar, PharmD '[]'$  Royetta Asal, PharmD '[]'$  Graylin Shiver, Rph '[]'$  Rema Fendt) Glennon Mac, PharmD '[]'$  Arlyn Dunning, PharmD '[]'$  Netta Cedars, PharmD '[]'$  Dia Sitter, PharmD '[]'$  Leone Haven, PharmD '[]'$  Gretta Arab, PharmD '[]'$  Theodis Shove, PharmD '[]'$  Peggyann Juba, PharmD '[]'$  Reuel Boom, PharmD   Positive urine culture  Spoke with patient. Pt stated he is not having any pain, frequency, urgency or dysuria)  Pt is waiting for a call back from his doctor to discuss follow up appt. No treatment needed at this time per [facility], PA-C  Rosie Fate 03/20/2022, 12:19 PM

## 2022-03-20 NOTE — Progress Notes (Signed)
ED Antimicrobial Stewardship Positive Culture Follow Up   Ronnie Salinas is an 66 y.o. male who presented to [facility] on 03/17/2022 with a chief complaint of  Chief Complaint  Patient presents with   Flank Pain    Recent Results (from the past 720 hour(s))  Urine Culture     Status: Abnormal   Collection Time: 03/17/22  2:04 AM   Specimen: Urine, Clean Catch  Result Value Ref Range Status   Specimen Description   Final    URINE, CLEAN CATCH Performed at [facility], [address] [place], [place] [address]    Special Requests   Final    NONE Performed at [facility], [address] [place], [place] [address]    Culture >=100,000 COLONIES/mL ESCHERICHIA COLI (A)  Final   Report Status 03/19/2022 FINAL  Final   Organism ID, Bacteria ESCHERICHIA COLI (A)  Final      Susceptibility   Escherichia coli - MIC*    AMPICILLIN >=32 RESISTANT Resistant     CEFAZOLIN 32 INTERMEDIATE Intermediate     CEFEPIME <=0.12 SENSITIVE Sensitive     CEFTRIAXONE 1 SENSITIVE Sensitive     CIPROFLOXACIN <=0.25 SENSITIVE Sensitive     GENTAMICIN <=1 SENSITIVE Sensitive     IMIPENEM <=0.25 SENSITIVE Sensitive     NITROFURANTOIN <=16 SENSITIVE Sensitive     TRIMETH/SULFA <=20 SENSITIVE Sensitive     AMPICILLIN/SULBACTAM >=32 RESISTANT Resistant     PIP/TAZO 8 SENSITIVE Sensitive     * >=100,000 COLONIES/mL ESCHERICHIA COLI    Recommend calling patient to assess if experiencing symptoms of frequency, urgency, dysuria and whether follow up obtained with PCP or urology. If still having symptoms on not already received follow up, prescribe cephalexin '500mg'$  PO BID x 7 days  New antibiotic prescription: Cephalexin '500mg'$  BID x 7 days  ED Provider: Steva Colder, [place], PharmD 03/20/2022, 11:52 AM Clinical Pharmacist Monday - Friday phone -  [phone] Saturday - Sunday phone - [phone]

## 2022-03-29 ENCOUNTER — Telehealth (HOSPITAL_BASED_OUTPATIENT_CLINIC_OR_DEPARTMENT_OTHER): Payer: Self-pay

## 2022-03-29 NOTE — Telephone Encounter (Signed)
Received call from [place], who states patient was supposed to be called in Keflex due to a positive urine culture result. According to chart, Soijet, PA agreed to order keflex. Patient was never called to be informed of prescription and pharmacy did not have prescription. Dr. Rogene Houston, [place] notified and written prescription printed. Patient notified and agreeable to pick up prescription physically to drop off at [facility].

## 2022-12-03 ENCOUNTER — Inpatient Hospital Stay (HOSPITAL_BASED_OUTPATIENT_CLINIC_OR_DEPARTMENT_OTHER)
Admission: EM | Admit: 2022-12-03 | Discharge: 2022-12-05 | DRG: 872 | Disposition: A | Discharge status: Home / Self Care | Payer: No Typology Code available for payment source | Attending: Internal Medicine | Admitting: Internal Medicine

## 2022-12-03 ENCOUNTER — Emergency Department (HOSPITAL_BASED_OUTPATIENT_CLINIC_OR_DEPARTMENT_OTHER): Payer: No Typology Code available for payment source

## 2022-12-03 ENCOUNTER — Other Ambulatory Visit: Payer: Self-pay

## 2022-12-03 ENCOUNTER — Encounter (HOSPITAL_BASED_OUTPATIENT_CLINIC_OR_DEPARTMENT_OTHER): Payer: Self-pay

## 2022-12-03 DIAGNOSIS — K5730 Diverticulosis of large intestine without perforation or abscess without bleeding: Secondary | ICD-10-CM | POA: Diagnosis present

## 2022-12-03 DIAGNOSIS — E785 Hyperlipidemia, unspecified: Secondary | ICD-10-CM | POA: Diagnosis present

## 2022-12-03 DIAGNOSIS — I129 Hypertensive chronic kidney disease with stage 1 through stage 4 chronic kidney disease, or unspecified chronic kidney disease: Secondary | ICD-10-CM | POA: Diagnosis present

## 2022-12-03 DIAGNOSIS — Z79899 Other long term (current) drug therapy: Secondary | ICD-10-CM

## 2022-12-03 DIAGNOSIS — Z8249 Family history of ischemic heart disease and other diseases of the circulatory system: Secondary | ICD-10-CM

## 2022-12-03 DIAGNOSIS — N410 Acute prostatitis: Secondary | ICD-10-CM | POA: Diagnosis present

## 2022-12-03 DIAGNOSIS — A419 Sepsis, unspecified organism: Secondary | ICD-10-CM | POA: Diagnosis present

## 2022-12-03 DIAGNOSIS — N281 Cyst of kidney, acquired: Secondary | ICD-10-CM | POA: Diagnosis present

## 2022-12-03 DIAGNOSIS — N1831 Chronic kidney disease, stage 3a: Secondary | ICD-10-CM | POA: Diagnosis present

## 2022-12-03 DIAGNOSIS — Z7982 Long term (current) use of aspirin: Secondary | ICD-10-CM | POA: Diagnosis not present

## 2022-12-03 DIAGNOSIS — N390 Urinary tract infection, site not specified: Secondary | ICD-10-CM | POA: Diagnosis present

## 2022-12-03 DIAGNOSIS — N400 Enlarged prostate without lower urinary tract symptoms: Secondary | ICD-10-CM | POA: Diagnosis present

## 2022-12-03 DIAGNOSIS — Z1152 Encounter for screening for COVID-19: Secondary | ICD-10-CM

## 2022-12-03 DIAGNOSIS — Z833 Family history of diabetes mellitus: Secondary | ICD-10-CM | POA: Diagnosis not present

## 2022-12-03 DIAGNOSIS — I2510 Atherosclerotic heart disease of native coronary artery without angina pectoris: Secondary | ICD-10-CM | POA: Diagnosis present

## 2022-12-03 DIAGNOSIS — Z1624 Resistance to multiple antibiotics: Secondary | ICD-10-CM | POA: Diagnosis present

## 2022-12-03 DIAGNOSIS — E872 Acidosis, unspecified: Secondary | ICD-10-CM | POA: Diagnosis present

## 2022-12-03 DIAGNOSIS — B9620 Unspecified Escherichia coli [E. coli] as the cause of diseases classified elsewhere: Secondary | ICD-10-CM | POA: Diagnosis present

## 2022-12-03 DIAGNOSIS — N419 Inflammatory disease of prostate, unspecified: Secondary | ICD-10-CM | POA: Diagnosis present

## 2022-12-03 LAB — CBC WITH DIFFERENTIAL/PLATELET
Abs Immature Granulocytes: 0.05 10*3/uL (ref 0.00–0.07)
Basophils Absolute: 0 10*3/uL (ref 0.0–0.1)
Basophils Relative: 0 %
Eosinophils Absolute: 0.1 10*3/uL (ref 0.0–0.5)
Eosinophils Relative: 0 %
HCT: 38.1 % — ABNORMAL LOW (ref 39.0–52.0)
Hemoglobin: 12.7 g/dL — ABNORMAL LOW (ref 13.0–17.0)
Immature Granulocytes: 0 %
Lymphocytes Relative: 5 %
Lymphs Abs: 0.8 10*3/uL (ref 0.7–4.0)
MCH: 28.2 pg (ref 26.0–34.0)
MCHC: 33.3 g/dL (ref 30.0–36.0)
MCV: 84.7 fL (ref 80.0–100.0)
Monocytes Absolute: 1.3 10*3/uL — ABNORMAL HIGH (ref 0.1–1.0)
Monocytes Relative: 9 %
Neutro Abs: 12.2 10*3/uL — ABNORMAL HIGH (ref 1.7–7.7)
Neutrophils Relative %: 86 %
Platelets: 237 10*3/uL (ref 150–400)
RBC: 4.5 MIL/uL (ref 4.22–5.81)
RDW: 14.6 % (ref 11.5–15.5)
WBC: 14.3 10*3/uL — ABNORMAL HIGH (ref 4.0–10.5)
nRBC: 0 % (ref 0.0–0.2)

## 2022-12-03 LAB — URINALYSIS, W/ REFLEX TO CULTURE (INFECTION SUSPECTED)
Bilirubin Urine: NEGATIVE
Glucose, UA: NEGATIVE mg/dL
Ketones, ur: NEGATIVE mg/dL
Nitrite: NEGATIVE
Protein, ur: NEGATIVE mg/dL
Specific Gravity, Urine: 1.02 (ref 1.005–1.030)
pH: 6 (ref 5.0–8.0)

## 2022-12-03 LAB — COMPREHENSIVE METABOLIC PANEL
ALT: 19 U/L (ref 0–44)
AST: 21 U/L (ref 15–41)
Albumin: 4.1 g/dL (ref 3.5–5.0)
Alkaline Phosphatase: 55 U/L (ref 38–126)
Anion gap: 10 (ref 5–15)
BUN: 25 mg/dL — ABNORMAL HIGH (ref 8–23)
CO2: 24 mmol/L (ref 22–32)
Calcium: 9 mg/dL (ref 8.9–10.3)
Chloride: 104 mmol/L (ref 98–111)
Creatinine, Ser: 1.49 mg/dL — ABNORMAL HIGH (ref 0.61–1.24)
GFR, Estimated: 52 mL/min — ABNORMAL LOW (ref >60)
Glucose, Bld: 106 mg/dL — ABNORMAL HIGH (ref 70–99)
Potassium: 3.7 mmol/L (ref 3.5–5.1)
Sodium: 138 mmol/L (ref 135–145)
Total Bilirubin: 1 mg/dL (ref 0.3–1.2)
Total Protein: 7.8 g/dL (ref 6.5–8.1)

## 2022-12-03 LAB — PROTIME-INR
INR: 1.2 (ref 0.8–1.2)
Prothrombin Time: 15.8 seconds — ABNORMAL HIGH (ref 11.4–15.2)

## 2022-12-03 LAB — SARS CORONAVIRUS 2 BY RT PCR: SARS Coronavirus 2 by RT PCR: NEGATIVE

## 2022-12-03 LAB — LACTIC ACID, PLASMA
Lactic Acid, Venous: 2.2 mmol/L (ref 0.5–1.9)
Lactic Acid, Venous: 1.7 mmol/L (ref 0.5–1.9)

## 2022-12-03 LAB — APTT: aPTT: 25 seconds (ref 24–36)

## 2022-12-03 MED ORDER — ASPIRIN 81 MG PO TBEC
81.0000 mg | DELAYED_RELEASE_TABLET | Freq: Every day | ORAL | Status: DC
  Administered 2022-12-03 – 2022-12-05 (×3): 81 mg via ORAL
  Filled 2022-12-03 (×3): qty 1

## 2022-12-03 MED ORDER — LACTATED RINGERS IV BOLUS (SEPSIS): 1000.0000 mL | Freq: Once | INTRAVENOUS | Status: DC

## 2022-12-03 MED ORDER — ACETAMINOPHEN 650 MG RE SUPP: 650.0000 mg | Freq: Four times a day (QID) | RECTAL | Status: DC | PRN

## 2022-12-03 MED ORDER — NEBIVOLOL HCL 10 MG PO TABS: 10.0000 mg | ORAL_TABLET | Freq: Every day | ORAL | Status: DC

## 2022-12-03 MED ORDER — ONDANSETRON HCL 4 MG PO TABS: 4.0000 mg | ORAL_TABLET | Freq: Four times a day (QID) | ORAL | Status: DC | PRN

## 2022-12-03 MED ORDER — CIPROFLOXACIN IN D5W 400 MG/200ML IV SOLN
400.0000 mg | Freq: Two times a day (BID) | INTRAVENOUS | Status: DC
  Administered 2022-12-03 – 2022-12-05 (×4): 400 mg via INTRAVENOUS
  Filled 2022-12-03 (×4): qty 200

## 2022-12-03 MED ORDER — TRAZODONE HCL 50 MG PO TABS: 50.0000 mg | ORAL_TABLET | Freq: Every evening | ORAL | Status: DC | PRN

## 2022-12-03 MED ORDER — OXYCODONE HCL 5 MG PO TABS: 5.0000 mg | ORAL_TABLET | ORAL | Status: DC | PRN

## 2022-12-03 MED ORDER — LACTATED RINGERS IV BOLUS (SEPSIS)
1000.0000 mL | Freq: Once | INTRAVENOUS | Status: AC
  Administered 2022-12-03: 1000 mL via INTRAVENOUS

## 2022-12-03 MED ORDER — ACETAMINOPHEN 325 MG PO TABS
650.0000 mg | ORAL_TABLET | Freq: Once | ORAL | Status: AC
  Administered 2022-12-03: 650 mg via ORAL
  Filled 2022-12-03: qty 2

## 2022-12-03 MED ORDER — ENOXAPARIN SODIUM 60 MG/0.6ML IJ SOSY
60.0000 mg | PREFILLED_SYRINGE | INTRAMUSCULAR | Status: DC
  Administered 2022-12-03 – 2022-12-04 (×2): 60 mg via SUBCUTANEOUS
  Filled 2022-12-03 (×2): qty 0.6

## 2022-12-03 MED ORDER — SODIUM CHLORIDE 0.9 % IV BOLUS
500.0000 mL | Freq: Once | INTRAVENOUS | Status: AC
  Administered 2022-12-03: 500 mL via INTRAVENOUS

## 2022-12-03 MED ORDER — ACETAMINOPHEN 325 MG PO TABS: 650.0000 mg | ORAL_TABLET | Freq: Four times a day (QID) | ORAL | Status: DC | PRN

## 2022-12-03 MED ORDER — LACTATED RINGERS IV SOLN: INTRAVENOUS | Status: DC

## 2022-12-03 MED ORDER — ONDANSETRON HCL 4 MG/2ML IJ SOLN: 4.0000 mg | Freq: Four times a day (QID) | INTRAMUSCULAR | Status: DC | PRN

## 2022-12-03 MED ORDER — IOHEXOL 300 MG/ML  SOLN
100.0000 mL | Freq: Once | INTRAMUSCULAR | Status: AC | PRN
  Administered 2022-12-03: 100 mL via INTRAVENOUS

## 2022-12-03 MED ORDER — LACTATED RINGERS IV BOLUS: 500.0000 mL | Freq: Once | INTRAVENOUS | Status: DC

## 2022-12-03 MED ORDER — ONDANSETRON HCL 4 MG/2ML IJ SOLN
4.0000 mg | Freq: Once | INTRAMUSCULAR | Status: AC
  Administered 2022-12-03: 4 mg via INTRAVENOUS
  Filled 2022-12-03: qty 2

## 2022-12-03 MED ORDER — ACETAMINOPHEN 325 MG PO TABS
650.0000 mg | ORAL_TABLET | Freq: Four times a day (QID) | ORAL | Status: DC | PRN
  Administered 2022-12-03: 650 mg via ORAL
  Filled 2022-12-03: qty 2

## 2022-12-03 MED ORDER — AMLODIPINE BESYLATE 10 MG PO TABS
10.0000 mg | ORAL_TABLET | Freq: Every day | ORAL | Status: DC
  Administered 2022-12-04 – 2022-12-05 (×2): 10 mg via ORAL
  Filled 2022-12-03 (×2): qty 1

## 2022-12-03 MED ORDER — SODIUM CHLORIDE 0.9 % IV SOLN
1.0000 g | Freq: Once | INTRAVENOUS | Status: AC
  Administered 2022-12-03: 1 g via INTRAVENOUS
  Filled 2022-12-03: qty 10

## 2022-12-03 NOTE — ED Notes (Signed)
Back from CT, resting/ sleeping.

## 2022-12-03 NOTE — ED Notes (Signed)
ED PA at [facility] 

## 2022-12-03 NOTE — H&P (Signed)
History and Physical    KYRELL FILES VHQ:[id number] DOB: 1955-05-12 DOA: 12/03/2022  [facility]   Chief Complaint:  dysuria  HPI: Ronnie Salinas is a 67 y.o. male with medical history significant of hypertension, hyperlipidemia who presented to the emergency department due to urinary frequency and urgency for last 2 days.  Patient is previously seen a urologist.  He had some fevers at home so he presented to the ER for further assessment.  On arrival he was tachycardic and febrile.  Labs were pain which showed urinalysis possible infection, CMP showed creatinine 1.4, glucose 106, WBC 14.3, hemoglobin 12.7, blood cultures pending, lactic acid 2.2, 1.7.  INR 1.2.  CT abdomen pelvis showed prostatic megaly consistent with prostatitis.  There is also development of renal cyst with recommended outpatient follow-up.  Chest x-ray showed no acute findings.  Patient was admitted for sepsis secondary to prostatitis.  He was started on IV Cipro and IV fluids.  On evaluation ehwas resting comfortably and is doing much better with IVF, abx.    Review of Systems: Review of Systems  Constitutional:  Positive for fever.  HENT: Negative.    Eyes: Negative.   Respiratory: Negative.    Cardiovascular: Negative.   Gastrointestinal: Negative.   Genitourinary:  Positive for dysuria and urgency.  Skin: Negative.      As per HPI otherwise 10 point review of systems negative.   No Known Allergies  Past Medical History:  Diagnosis Date   Hyperlipidemia    Hypertension    Stomach problems     Past Surgical History:  Procedure Laterality Date   COLONOSCOPY  2016   ESOPHAGOGASTRODUODENOSCOPY     bone stuck in throat   UPPER GASTROINTESTINAL ENDOSCOPY       reports that he has never smoked. He has never used smokeless tobacco. He reports that he does not currently use alcohol. He reports that he does not use drugs.  Family History  Problem Relation Age of Onset   Cancer Mother     Diabetes Mother    Hypertension Mother    Hypertension Father    Heart attack Cousin    Colon cancer Neg Hx    Colon polyps Neg Hx    Esophageal cancer Neg Hx    Stomach cancer Neg Hx    Rectal cancer Neg Hx     Prior to Admission medications   Medication Sig Start Date End Date Taking? Authorizing Provider  Amlodipine-Valsartan-HCTZ 10-160-25 MG TABS Take by mouth.    [provider]  amLODIPine-Valsartan-HCTZ 10-320-25 MG TABS TAKE 1 TABLET BY MOUTH EVERY DAY 07/13/16   [provider]  aspirin 81 MG EC tablet TAKE 1 TABLET BY MOUTH EVERY DAY 07/04/16   [provider]  cloNIDine (CATAPRES) 0.1 MG tablet Take 0.1 mg by mouth 2 (two) times daily.      [provider]  Multiple Vitamins-Minerals (MENS 50+ MULTI VITAMIN/MIN) TABS Take by mouth.    [provider]  nebivolol (BYSTOLIC) 10 MG tablet Take 10 mg by mouth daily.    [provider]  potassium chloride (MICRO-K) 10 MEQ CR capsule Take 10 mEq by mouth 2 (two) times daily.      [provider]  spironolactone (ALDACTONE) 25 MG tablet Take 12.5 mg by mouth daily. 06/11/19   [provider]    Physical Exam: Vitals:   12/03/22 1435 12/03/22 1445 12/03/22 1500 12/03/22 1741  BP: 123/76  129/84 123/79  Pulse: (!) 106  (!)  103 84  Resp:   14 18  Temp:  (!) 101.7 F (38.7 C)  98.9 F (37.2 C)  TempSrc:  Oral  Oral  SpO2: 95%  97% 98%  Weight:      Height:       Physical Exam Constitutional:      Appearance: He is normal weight.  HENT:     Head: Normocephalic.     Mouth/Throat:     Mouth: Mucous membranes are moist.     Pharynx: Oropharynx is clear.  Eyes:     Pupils: Pupils are equal, round, and reactive to light.  Cardiovascular:     Rate and Rhythm: Normal rate and regular rhythm.  Pulmonary:     Effort: Pulmonary effort is normal.  Abdominal:     General: Abdomen is flat. Bowel sounds are normal.  Musculoskeletal:        General: Normal  range of motion.  Skin:    General: Skin is warm.     Capillary Refill: Capillary refill takes less than 2 seconds.  Neurological:     Mental Status: He is alert. Mental status is at baseline.       Labs on Admission: I have personally reviewed the patients's labs and imaging studies.  Assessment/Plan Principal Problem:   Sepsis secondary to UTI [facility]) Active Problems:   Prostatitis   Sepsis secondary to prostatitis - Mild lactic acidosis - Febrile and tachycardic  Plan: Continue IV fluids Continue IV Cipro Urology consulted  # CAD-continue aspirin  #Htn- cotninue amlodipine, hold valsartan  #Insmonia- continue PRN trazodone  Admission status: Inpatient Med-Surg  Certification: The appropriate patient status for this patient is INPATIENT. Inpatient status is judged to be reasonable and necessary in order to provide the required intensity of service to ensure the patient's safety. The patient's presenting symptoms, physical exam findings, and initial radiographic and laboratory data in the context of their chronic comorbidities is felt to place them at high risk for further clinical deterioration. Furthermore, it is not anticipated that the patient will be medically stable for discharge from the hospital within 2 midnights of admission.   * I certify that at the point of admission it is my clinical judgment that the patient will require inpatient hospital care spanning beyond 2 midnights from the point of admission due to high intensity of service, high risk for further deterioration and high frequency of surveillance required.Alan Mulder MD [facility] If 7PM-7AM, please contact night-coverage [email or web address]  12/03/2022, 6:27 PM

## 2022-12-03 NOTE — ED Notes (Signed)
To CT via stretcher

## 2022-12-03 NOTE — ED Triage Notes (Signed)
The patient stated he has been cold all over for two days. Then having urinary urinary urgency but then when he goes he has a small stream. He is having pain all over.

## 2022-12-03 NOTE — ED Provider Notes (Signed)
[facility] Provider Note   CSN: [id number] Arrival date & time: 12/03/22  2952     History  Chief Complaint  Patient presents with   Dysuria    Ronnie Salinas is a 67 y.o. male history of hypertension, hyperlipidemia, CKD stage II presented for urinary urgency with oliguria for the past 2 days.  Patient states he had recent cystoscopy done by urologist that did not show any abnormalities as patient states he has had this problem for repeatedly over the past few years.  Patient denies any dysuria or hematuria but does note that last night he felt feverish without taking a temperature.  Patient denies any nausea/vomiting, abdominal pain, change in sensation/motor skills, flank pain, chest pain, shortness of breath, back pain, testicular pain/changes.    Home Medications Prior to Admission medications   Medication Sig Start Date End Date Taking? Authorizing Provider  Amlodipine-Valsartan-HCTZ 10-160-25 MG TABS Take by mouth.    [provider]  amLODIPine-Valsartan-HCTZ 10-320-25 MG TABS TAKE 1 TABLET BY MOUTH EVERY DAY 07/13/16   [provider]  aspirin 81 MG EC tablet TAKE 1 TABLET BY MOUTH EVERY DAY 07/04/16   [provider]  cloNIDine (CATAPRES) 0.1 MG tablet Take 0.1 mg by mouth 2 (two) times daily.      [provider]  Multiple Vitamins-Minerals (MENS 50+ MULTI VITAMIN/MIN) TABS Take by mouth.    [provider]  nebivolol (BYSTOLIC) 10 MG tablet Take 10 mg by mouth daily.    [provider]  potassium chloride (MICRO-K) 10 MEQ CR capsule Take 10 mEq by mouth 2 (two) times daily.      [provider]  spironolactone (ALDACTONE) 25 MG tablet Take 12.5 mg by mouth daily. 06/11/19   [provider]      Allergies    Patient has no known allergies.    Review of Systems   Review of Systems  Genitourinary:  Positive for dysuria.    Physical Exam Updated Vital  Signs BP 126/77 (BP Location: Right Arm)   Pulse (!) 104   Temp (!) 102.3 F (39.1 C) (Oral)   Resp 18   Ht 5\' 10"  (1.778 m)   Wt 118 kg   SpO2 97%   BMI 37.33 kg/m  Physical Exam Constitutional:      General: He is not in acute distress.    Appearance: He is not ill-appearing.  Cardiovascular:     Rate and Rhythm: Normal rate and regular rhythm.     Pulses: Normal pulses.  Pulmonary:     Effort: Pulmonary effort is normal.  Abdominal:     General: There is no distension.     Palpations: Abdomen is soft.     Tenderness: There is no abdominal tenderness. There is no right CVA tenderness, left CVA tenderness, guarding or rebound.  Musculoskeletal:        General: Normal range of motion.  Skin:    General: Skin is warm and dry.     Comments: No overlying skin color changes  Neurological:     Mental Status: He is alert.  Psychiatric:        Mood and Affect: Mood normal.     ED Results / Procedures / Treatments   Labs (all labs ordered are listed, but only abnormal results are displayed) Labs Reviewed  COMPREHENSIVE METABOLIC PANEL - Abnormal; Notable for the following components:      Result Value   Glucose, Bld  106 (*)    BUN 25 (*)    Creatinine, Ser 1.49 (*)    GFR, Estimated 52 (*)    All other components within normal limits  CBC WITH DIFFERENTIAL/PLATELET - Abnormal; Notable for the following components:   WBC 14.3 (*)    Hemoglobin 12.7 (*)    HCT 38.1 (*)    Neutro Abs 12.2 (*)    Monocytes Absolute 1.3 (*)    All other components within normal limits  URINALYSIS, W/ REFLEX TO CULTURE (INFECTION SUSPECTED) - Abnormal; Notable for the following components:   APPearance HAZY (*)    Hgb urine dipstick TRACE (*)    Leukocytes,Ua SMALL (*)    Bacteria, UA MANY (*)    All other components within normal limits  LACTIC ACID, PLASMA - Abnormal; Notable for the following components:   Lactic Acid, Venous 2.2 (*)    All other components within normal limits   SARS CORONAVIRUS 2 BY RT PCR  URINE CULTURE  CULTURE, BLOOD (ROUTINE X 2)  CULTURE, BLOOD (ROUTINE X 2)  LACTIC ACID, PLASMA    EKG None  Radiology CT ABDOMEN PELVIS W CONTRAST  Result Date: 12/03/2022 CLINICAL DATA:  Pyelonephritis suspected, chills, urinary urgency and pain * Tracking Code: BO * EXAM: CT ABDOMEN AND PELVIS WITH CONTRAST TECHNIQUE: Multidetector CT imaging of the abdomen and pelvis was performed using the standard protocol following bolus administration of intravenous contrast. RADIATION DOSE REDUCTION: This exam was performed according to the departmental dose-optimization program which includes automated exposure control, adjustment of the mA and/or kV according to patient size and/or use of iterative reconstruction technique. CONTRAST:  OMNIPAQUE IOHEXOL 300 MG/ML  SOLN COMPARISON:  02/15/2022 FINDINGS: Lower chest: No acute abnormality. Coronary artery calcifications. Small hiatal hernia. Hepatobiliary: No solid liver abnormality is seen. Hepatic steatosis. No gallstones, gallbladder wall thickening, or biliary dilatation. Pancreas: Unremarkable. No pancreatic ductal dilatation or surrounding inflammatory changes. Spleen: Normal in size without significant abnormality. Adrenals/Urinary Tract: Adrenal glands are unremarkable. Continued interval enlargement of an intermediate attenuation lesion of the anterior inferior pole of the right kidney adjacent to a larger, benign fluid attenuation cysts. Lesion measures 2.9 x 1.8 cm, previously 2.6 x 1.5 cm (series 2, image 50). The left kidney is normal. No calculi or hydronephrosis. Bladder is unremarkable. Stomach/Bowel: Stomach is within normal limits. Appendix appears normal. No evidence of bowel wall thickening, distention, or inflammatory changes. Descending and sigmoid diverticulosis. Vascular/Lymphatic: Aortic atherosclerosis. No enlarged abdominal or pelvic lymph nodes. Reproductive: Prostatomegaly. Fat stranding about the  prostate (series 602, image 104). Other: Broad-based fat containing midline ventral hernia. No ascites. Musculoskeletal: No acute or significant osseous findings. IMPRESSION: 1. Prostatomegaly with fat stranding about the prostate, consistent with prostatitis. Correlate with urinalysis. 2. Continued interval enlargement of an intermediate attenuation lesion of the anterior inferior pole of the right kidney adjacent to a larger, benign fluid attenuation cyst. This may reflect a benign hemorrhagic or proteinaceous cyst but is incompletely characterized and remains suspicious for solid renal neoplasm, especially given continued enlargement over time. Recommend multiphasic contrast enhanced CT or MRI for further evaluation on a nonemergent, outpatient basis. 3. No CT evidence of pyelonephritis. 4. Descending and sigmoid diverticulosis without evidence of acute diverticulitis. 5. Hepatic steatosis. 6. Coronary artery disease. Aortic Atherosclerosis (ICD10-I70.0). Electronically Signed   By: Jearld Lesch M.D.   On: 12/03/2022 12:54    Procedures .Critical Care  Performed by: Netta Corrigan, PA-C Authorized by: Netta Corrigan, PA-C   Critical  care provider statement:    Critical care time (minutes):  30   Critical care time was exclusive of:  Separately billable procedures and treating other patients   Critical care was necessary to treat or prevent imminent or life-threatening deterioration of the following conditions:  Sepsis   Critical care was time spent personally by me on the following activities:  Blood draw for specimens, development of treatment plan with patient or surrogate, discussions with consultants, evaluation of patient's response to treatment, examination of patient, obtaining history from patient or surrogate, review of old charts, re-evaluation of patient's condition, pulse oximetry, ordering and review of radiographic studies, ordering and review of laboratory studies and ordering and  performing treatments and interventions   I assumed direction of critical care for this patient from another provider in my specialty: no     Care discussed with: admitting provider       Medications Ordered in ED Medications  lactated ringers infusion (has no administration in time range)  lactated ringers bolus 1,000 mL (has no administration in time range)    And  lactated ringers bolus 1,000 mL (has no administration in time range)    And  lactated ringers bolus 1,000 mL (has no administration in time range)    And  lactated ringers bolus 1,000 mL (has no administration in time range)  acetaminophen (TYLENOL) tablet 650 mg (650 mg Oral Given 12/03/22 1023)  iohexol (OMNIPAQUE) 300 MG/ML solution 100 mL (100 mLs Intravenous Contrast Given 12/03/22 1218)  ondansetron (ZOFRAN) injection 4 mg (4 mg Intravenous Given 12/03/22 1206)  cefTRIAXone (ROCEPHIN) 1 g in sodium chloride 0.9 % 100 mL IVPB (1 g Intravenous New Bag/Given 12/03/22 1318)  sodium chloride 0.9 % bolus 500 mL (500 mLs Intravenous New Bag/Given 12/03/22 1318)    ED Course/ Medical Decision Making/ A&P                                 Medical Decision Making Amount and/or Complexity of Data Reviewed Labs: ordered. Radiology: ordered.  Risk OTC drugs. Prescription drug management. Decision regarding hospitalization.   Blenda Bridegroom 67 y.o. presented today for urinary urgency with oliguria. Working DDx that I considered at this time includes, but not limited to, UTI, BPH, prostatitis, pyelonephritis, nephrolithiasis, urosepsis.  R/o DDx: BPH, pyelonephritis, nephrolithiasis,: These are considered less likely due to history of present illness and physical exam findings  Review of prior external notes: 12/01/2022 telephone  Unique Tests and My Interpretation:  CBC: Leukocytosis 14.3 CMP: Similar to previous reading UA: Many bacteria, small leukocytes Bladder scan: 0 cc Lactic acid: 2.2 COVID-negative Chest  x-ray: Pending CT abdomen pelvis contrast: Acute prostatitis, renal cyst noted that has been seen on previous imaging  Discussion with Independent Historian:  Significant other  Discussion of Management of Tests:  Lynden Oxford, MD Hospitalist  Risk: High: hospitalization or escalation of hospital-level care  Risk Stratification Score: None  Staffed with Belfi, MD  Plan: On exam patient was in no acute distress with stable vitals.  Patient's physical exam was unremarkable including not having abdominal tenderness or CVA tenderness.  Bladder scan was ordered to further evaluate patient's urinary urgency with oligouria.  Due to patient having reported fevers at home basic labs are ordered to further evaluate.  Upon chart review it appears that in patient's urology note he was supposed to have a CT renal mass study done however it does not appear  he was ever contacted for this.  Patient's labs with leukocytosis of 14.3.  Upon chart review it appears that patient on 08/12/2022 had a similar leukocytosis of 14.48 and then he had pyelonephritis and was treated with Rocephin.  Given patient's persistent leukocytosis along with being elderly with borderline temperature of 99.6 F we will proceed with CT scan.  Patient's temperature was rechecked and was 102.3 F.  Patient also became slightly tachycardic at 104.  CT scan shows the patient has prostatitis which would explain his symptoms.  However given patient's presentation and elevated lactic acid patient meets sepsis criteria and code sepsis was initiated.  Patient is already being treated with Rocephin.  The attending went to speak with the patient patient was endorsing URI-like symptoms which was not endorsed to me however we will order COVID and chest x-ray as we plan for admission given patient's deteriorating state while here.  I spoke to the patient about admission patient is agreeable to admission.  Hospitalist consulted.  I spoke to the  hospitalist and hospitalist accepted patient for admission.  Patient stable for admission.        Final Clinical Impression(s) / ED Diagnoses Final diagnoses:  Sepsis, due to unspecified organism, unspecified whether acute organ dysfunction present [facility])  Acute prostatitis    Rx / DC Orders ED Discharge Orders     None         Remi Deter 12/03/22 1435    Rolan Bucco, MD 12/03/22 1457

## 2022-12-03 NOTE — ED Notes (Signed)
Called [facility] for transport at 2:56.

## 2022-12-03 NOTE — Progress Notes (Signed)
Plan of Care Note for accepted transfer  Patient: Ronnie Salinas    GUY:[id number]  DOA: 12/03/2022     Nursing staff, Please call [facility] Admits & Consults System-Wide number on Amion as soon as patient's arrival to the unit (not the listed attending) so that the appropriate admitting provider can evaluate the pt. ASAP to avoid any delay in care.  Facility requesting transfer: [facility] Requesting Provider: Evlyn Kanner Reason for transfer: admission Facility course: presented with chills, fever and leucocytosis. Meeting SIRS criteria. CT shows prostatitis as source of infection. Now on Antibiotics. Sees urology Dr Sherryl Barters at [facility] Covid negative.   Plan of care: The patient is accepted for admission to Med-surg  unit, at [facility].    Author: Lynden Oxford, MD  12/03/2022  Check [email or web address] for on-call coverage.

## 2022-12-03 NOTE — ED Notes (Signed)
Spoke with provider about witholding 3rd liter of LR if Lactic Acid came back properly. 2L being given at present.

## 2022-12-03 NOTE — ED Notes (Signed)
Report given to Jerry with [facility]. 

## 2022-12-03 NOTE — ED Notes (Signed)
Called [facility] for transport at 4:09.

## 2022-12-03 NOTE — ED Notes (Signed)
Xray at [facility] 

## 2022-12-04 DIAGNOSIS — N390 Urinary tract infection, site not specified: Secondary | ICD-10-CM

## 2022-12-04 DIAGNOSIS — A419 Sepsis, unspecified organism: Secondary | ICD-10-CM | POA: Diagnosis not present

## 2022-12-04 LAB — BASIC METABOLIC PANEL
Anion gap: 5 (ref 5–15)
BUN: 22 mg/dL (ref 8–23)
CO2: 25 mmol/L (ref 22–32)
Calcium: 8.1 mg/dL — ABNORMAL LOW (ref 8.9–10.3)
Chloride: 104 mmol/L (ref 98–111)
Creatinine, Ser: 1.37 mg/dL — ABNORMAL HIGH (ref 0.61–1.24)
GFR, Estimated: 57 mL/min — ABNORMAL LOW (ref >60)
Glucose, Bld: 103 mg/dL — ABNORMAL HIGH (ref 70–99)
Potassium: 3.4 mmol/L — ABNORMAL LOW (ref 3.5–5.1)
Sodium: 134 mmol/L — ABNORMAL LOW (ref 135–145)

## 2022-12-04 LAB — CBC
HCT: 33.8 % — ABNORMAL LOW (ref 39.0–52.0)
Hemoglobin: 11 g/dL — ABNORMAL LOW (ref 13.0–17.0)
MCH: 28.3 pg (ref 26.0–34.0)
MCHC: 32.5 g/dL (ref 30.0–36.0)
MCV: 86.9 fL (ref 80.0–100.0)
Platelets: 197 10*3/uL (ref 150–400)
RBC: 3.89 MIL/uL — ABNORMAL LOW (ref 4.22–5.81)
RDW: 14.9 % (ref 11.5–15.5)
WBC: 14.6 10*3/uL — ABNORMAL HIGH (ref 4.0–10.5)
nRBC: 0 % (ref 0.0–0.2)

## 2022-12-04 LAB — HIV ANTIBODY (ROUTINE TESTING W REFLEX): HIV Screen 4th Generation wRfx: NONREACTIVE

## 2022-12-04 NOTE — Plan of Care (Signed)

## 2022-12-04 NOTE — Progress Notes (Signed)
PROGRESS NOTE  Ronnie Salinas  DOB: 1955-12-06  [facility] NWG:[id number]  DOA: 12/03/2022  LOS: 1 day  Hospital Day: 2  Brief narrative: Ronnie Salinas is a 67 y.o. male with PMH significant for HTN, HLD, CAD 8/22, patient presented to the ED with complaint of urinary frequency, urgency and fever for 2 days.  In the ED, he spiked a fever of 102.3, blood pressure 150s Labs with WC count of 14.3, hemoglobin 12.7, BUN/creatinine 25/1.49, lactic acid 2.2 Urinalysis with hazy yellow urine, small leukocytes, many bacteria CT abdomen pelvis showed prostatomegaly with fat stranding about the prostate, consistent with prostatitis.  No evidence of pyelonephritis.  Blood culture sent, urine culture was sent. Patient was started on IV antibiotics IV fluid Admitted to [facility]  Subjective: Patient was seen and examined this morning.  Pleasant middle-aged African-American male.  Propped up in bed.  Not in distress.  Feels better than at presentation. Overnight, patient had another fever spike of 101.7 Repeat labs with WBC count of 14.6, creatinine 1.37  Assessment and plan: Sepsis secondary to UTI, prostatitis Presented with urinary symptoms, fever, urinary symptoms WBC count, lactic acid level were elevated as below. Blood culture and urine culture sent Currently IV ciprofloxacin Continue to trend temperature, WBC count and lactic acid Recent Labs  Lab 12/03/22 1030 12/03/22 1300 12/04/22 0426  WBC 14.3*  --  14.6*  LATICACIDVEN  --  1.7  2.2*  --    Hypertension PTA meds-clonidine 0.1 mg twice daily, amlodipine 10 mg daily, HCTZ 25 mg daily, valsartan 320 mg daily, Aldactone 25 mg daily,  Currently blood pressure is controlled on amlodipine only.  Patient is pleasantly surprised with lower than his usual blood pressure without meds Continue to monitor   CKD 3A Per Care Everywhere, baseline creatinine 1.54 from May 2024 Creatinine remains at baseline Recent  Labs    12/03/22 1030 12/04/22 0426  BUN 25* 22  CREATININE 1.49* 1.37*   H/o CAD continue aspirin    Insmonia continue PRN trazodone  Renal cyst CT scan showed continued interval enlargement of an intermediate attenuation lesion of the anterior inferior pole of the right kidney adjacent to a larger, benign fluid attenuation cyst. This may reflect a benign hemorrhagic or proteinaceous cyst but is incompletely characterized and remains suspicious for solid renal neoplasm, especially given continued enlargement over time. Recommend multiphasic contrast enhanced CT or MRI for further evaluation on a nonemergent, outpatient basis. Patient follows up with neurologist at [facility].  Diverticulosis  CT scan showed descending and sigmoid diverticulosis without evidence of acute diverticulitis. Regular bowel regimen    Mobility: Encourage ambulation  Goals of care   Code Status: Full Code     DVT prophylaxis:  SCDs Start: 12/03/22 1826   Antimicrobials: IV ciprofloxacin Fluid: LR at 50 mL/h Consultants: None Family Communication: None at bedside  Status: Inpatient Level of care:  Med-Surg   Patient is from: Home Needs to continue in-hospital care: Need to continue IV antibiotics Anticipated d/c to: Home in 1 to 2 days      Diet:  Diet Order             Diet regular Room service appropriate? Yes; Fluid consistency: Thin  Diet effective now                   Scheduled Meds:  amLODipine  10 mg Oral Daily   aspirin EC  81 mg Oral Daily   enoxaparin (LOVENOX) injection  60  mg Subcutaneous Q24H    PRN meds: acetaminophen **OR** acetaminophen, ondansetron **OR** ondansetron (ZOFRAN) IV, oxyCODONE, traZODone   Infusions:   ciprofloxacin 400 mg (12/04/22 0943)   lactated ringers 50 mL/hr at 12/04/22 2956    Antimicrobials: Anti-infectives (From admission, onward)    Start     Dose/Rate Route Frequency Ordered Stop   12/03/22 1930  ciprofloxacin (CIPRO)  IVPB 400 mg        400 mg 200 mL/hr over 60 Minutes Intravenous Every 12 hours 12/03/22 1826 12/08/22 1929   12/03/22 1300  cefTRIAXone (ROCEPHIN) 1 g in sodium chloride 0.9 % 100 mL IVPB        1 g 200 mL/hr over 30 Minutes Intravenous  Once 12/03/22 1250 12/03/22 1350       Nutritional status:  Body mass index is 37.33 kg/m.          Objective: Vitals:   12/03/22 2146 12/04/22 0125  BP: 112/73 117/83  Pulse: 80 83  Resp: 20 18  Temp: 98.6 F (37 C) 98.6 F (37 C)  SpO2: 100% 99%    Intake/Output Summary (Last 24 hours) at 12/04/2022 1057 Last data filed at 12/04/2022 0000 Gross per 24 hour  Intake 1217.77 ml  Output --  Net 1217.77 ml   Filed Weights   12/03/22 0944  Weight: 118 kg   Weight change:  Body mass index is 37.33 kg/m.   Physical Exam: General exam: Pleasant, elderly African-American male.  Not in distress Skin: No rashes, lesions or ulcers. HEENT: Atraumatic, normocephalic, no obvious bleeding Lungs: Clear to auscultation bilaterally CVS: Regular rate and rhythm, no murmur GI/Abd soft, nontender, mild distended from obesity, bowel sound present CNS: Alert, awake monitor x 3 Psychiatry: Mood appropriate Extremities: No pedal edema, no calf tenderness  Data Review: I have personally reviewed the laboratory data and studies available.  F/u labs ordered Unresulted Labs (From admission, onward)     Start     Ordered   12/10/22 0500  Creatinine, serum  (enoxaparin (LOVENOX)    CrCl >/= 30 ml/min)  Weekly,   R     Comments: while on enoxaparin therapy    12/03/22 1826   12/05/22 0500  CBC with Differential/Platelet  Daily,   R      12/04/22 0858   12/05/22 0500  Basic metabolic panel  Daily,   R      12/04/22 0858   12/05/22 0500  Lactic acid, plasma  (Lactic Acid)  Tomorrow morning,   R        12/04/22 0858            Total time spent in review of labs and imaging, patient evaluation, formulation of plan, documentation and  communication with family: 55 minutes  Signed, Lorin Glass, MD [facility] 12/04/2022

## 2022-12-05 DIAGNOSIS — N390 Urinary tract infection, site not specified: Secondary | ICD-10-CM | POA: Diagnosis not present

## 2022-12-05 DIAGNOSIS — A419 Sepsis, unspecified organism: Secondary | ICD-10-CM | POA: Diagnosis not present

## 2022-12-05 LAB — CBC WITH DIFFERENTIAL/PLATELET
Abs Immature Granulocytes: 0.06 10*3/uL (ref 0.00–0.07)
Basophils Absolute: 0 10*3/uL (ref 0.0–0.1)
Basophils Relative: 0 %
Eosinophils Absolute: 0.2 10*3/uL (ref 0.0–0.5)
Eosinophils Relative: 1 %
HCT: 34.1 % — ABNORMAL LOW (ref 39.0–52.0)
Hemoglobin: 11.1 g/dL — ABNORMAL LOW (ref 13.0–17.0)
Immature Granulocytes: 1 %
Lymphocytes Relative: 9 %
Lymphs Abs: 1 10*3/uL (ref 0.7–4.0)
MCH: 28.3 pg (ref 26.0–34.0)
MCHC: 32.6 g/dL (ref 30.0–36.0)
MCV: 87 fL (ref 80.0–100.0)
Monocytes Absolute: 1.1 10*3/uL — ABNORMAL HIGH (ref 0.1–1.0)
Monocytes Relative: 10 %
Neutro Abs: 8.9 10*3/uL — ABNORMAL HIGH (ref 1.7–7.7)
Neutrophils Relative %: 79 %
Platelets: 190 10*3/uL (ref 150–400)
RBC: 3.92 MIL/uL — ABNORMAL LOW (ref 4.22–5.81)
RDW: 14.6 % (ref 11.5–15.5)
WBC: 11.3 10*3/uL — ABNORMAL HIGH (ref 4.0–10.5)
nRBC: 0 % (ref 0.0–0.2)

## 2022-12-05 LAB — BASIC METABOLIC PANEL
Anion gap: 7 (ref 5–15)
BUN: 16 mg/dL (ref 8–23)
CO2: 24 mmol/L (ref 22–32)
Calcium: 8.1 mg/dL — ABNORMAL LOW (ref 8.9–10.3)
Chloride: 105 mmol/L (ref 98–111)
Creatinine, Ser: 1.25 mg/dL — ABNORMAL HIGH (ref 0.61–1.24)
GFR, Estimated: >60 mL/min (ref >60)
Glucose, Bld: 102 mg/dL — ABNORMAL HIGH (ref 70–99)
Potassium: 3.2 mmol/L — ABNORMAL LOW (ref 3.5–5.1)
Sodium: 136 mmol/L (ref 135–145)

## 2022-12-05 LAB — URINE CULTURE: Culture: >=100000 — AB

## 2022-12-05 LAB — LACTIC ACID, PLASMA: Lactic Acid, Venous: 0.8 mmol/L (ref 0.5–1.9)

## 2022-12-05 MED ORDER — CIPROFLOXACIN HCL 500 MG PO TABS: 500.0000 mg | ORAL_TABLET | Freq: Two times a day (BID) | ORAL | Status: DC

## 2022-12-05 MED ORDER — SACCHAROMYCES BOULARDII 250 MG PO CAPS: 250.0000 mg | ORAL_CAPSULE | Freq: Two times a day (BID) | ORAL | 0 refills | Status: AC

## 2022-12-05 MED ORDER — CIPROFLOXACIN HCL 500 MG PO TABS: 500.0000 mg | ORAL_TABLET | Freq: Two times a day (BID) | ORAL | 0 refills | Status: AC

## 2022-12-05 MED ORDER — ASPIRIN 81 MG PO TBEC: 81.0000 mg | DELAYED_RELEASE_TABLET | Freq: Every day | ORAL | 12 refills | Status: AC

## 2022-12-05 MED ORDER — POTASSIUM CHLORIDE CRYS ER 20 MEQ PO TBCR
40.0000 meq | EXTENDED_RELEASE_TABLET | Freq: Once | ORAL | Status: AC
  Administered 2022-12-05: 40 meq via ORAL
  Filled 2022-12-05: qty 2

## 2022-12-05 NOTE — Discharge Summary (Signed)
Physician Discharge Summary  ZENO MATERNA NFA:[id number] DOB: [birth year] DOA: 12/03/2022  [facility]  Admit date: 12/03/2022 Discharge date: 12/05/2022  Admitted From: Home Discharge disposition: Home  Recommendations at discharge:  Complete 4 weeks course of ciprofloxacin with probiotics Out of your multiple blood pressure medications, continue amlodipine and clonidine.  Continue to hold HCTZ, valsartan and Aldactone. Continue to monitor blood pressure at home.  If systolic blood pressure is over 140, resume one medicine at a time.  Further adjustment may be needed as an outpatient under the care of his PCP.   Brief narrative: Ronnie Salinas is a 67 y.o. male with PMH significant for HTN, HLD, CAD 8/22, patient presented to the ED with complaint of urinary frequency, urgency and fever for 2 days.  In the ED, he spiked a fever of 102.3, blood pressure 150s Labs with WC count of 14.3, hemoglobin 12.7, BUN/creatinine 25/1.49, lactic acid 2.2 Urinalysis with hazy yellow urine, small leukocytes, many bacteria CT abdomen pelvis showed prostatomegaly with fat stranding about the prostate, consistent with prostatitis.  No evidence of pyelonephritis.  Blood culture sent, urine culture was sent. Patient was started on IV antibiotics IV fluid Admitted to [facility]  Subjective: Patient was seen and examined this morning. Lying on bed.  Not in distress.  Feels better.  Feels ready to go home.  Hospital course: Sepsis secondary to prostatitis E. coli UTI Presented with urinary symptoms, fever, urinary symptoms WBC count, lactic acid level were elevated as below. Blood culture did not show any growth.  Urine culture grew MDR E. coli Currently improving on IV ciprofloxacin.  Discussed with pharmacy.  Based on sensitivity pattern, plan to discharge on ciprofloxacin..  Prostatitis, patient needs longer than usual course of antibiotic treatment.  Will plan to discharge him on  oral ciprofloxacin for [redacted] weeks along with probiotics. Improved trend of temperature, WBC count and lactic acid. Patient follows up with urologist at [facility].  He states he has an upcoming follow-up in few weeks. Recent Labs  Lab 12/03/22 1030 12/03/22 1300 12/04/22 0426 12/05/22 0437  WBC 14.3*  --  14.6* 11.3*  LATICACIDVEN  --  1.7  2.2*  --  0.8   Hypertension PTA meds-clonidine 0.1 mg twice daily, amlodipine 10 mg daily, HCTZ 25 mg daily, valsartan 320 mg daily, Aldactone 25 mg daily,  Currently blood pressure is controlled on amlodipine only.  Likely because of infection.  At discharge, I would resume clonidine 0.1 mg twice daily as well to prevent rebound hypertension.  However he will still keep HCTZ, valsartan and Aldactone.  Continue to monitor blood pressure at home.  If systolic blood pressure is over 140, resume one medicine at a time.  Further adjustment may be needed as an outpatient under the care of his PCP   CKD 3A Per Care Everywhere, baseline creatinine 1.54 from May 2024 Creatinine remains at baseline Recent Labs    12/03/22 1030 12/04/22 0426 12/05/22 0437  BUN 25* 22 16  CREATININE 1.49* 1.37* 1.25*   H/o CAD continue aspirin    Insmonia continue PRN trazodone  Renal cyst CT scan showed continued interval enlargement of an intermediate attenuation lesion of the anterior inferior pole of the right kidney adjacent to a larger, benign fluid attenuation cyst. This may reflect a benign hemorrhagic or proteinaceous cyst but is incompletely characterized and remains suspicious for solid renal neoplasm, especially given continued enlargement over time. Recommend multiphasic contrast enhanced CT or MRI for further evaluation on  a nonemergent, outpatient basis. Patient follows up with neurologist at [facility].  Diverticulosis  CT scan showed descending and sigmoid diverticulosis without evidence of acute diverticulitis. Continue regular bowel regimen  Goals of  care   Code Status: Full Code   Wounds:    Discharge Exam:   Vitals:   12/04/22 0125 12/04/22 1257 12/04/22 2025 12/05/22 0507  BP: 117/83 127/80 132/80 131/82  Pulse: 83 74 72 65  Resp: 18  18 18   Temp: 98.6 F (37 C) 99.1 F (37.3 C) 98.2 F (36.8 C) 98.2 F (36.8 C)  TempSrc: Oral Oral Oral Oral  SpO2: 99% 100% 97% 97%  Weight:      Height:        Body mass index is 37.33 kg/m.   General exam: Pleasant, elderly African-American male.  Not in distress Skin: No rashes, lesions or ulcers. HEENT: Atraumatic, normocephalic, no obvious bleeding Lungs: Clear to auscultation bilaterally CVS: Regular rate and rhythm, no murmur GI/Abd soft, nontender, mild distended from obesity, bowel sound present CNS: Alert, awake monitor x 3 Psychiatry: Mood appropriate Extremities: No pedal edema, no calf tenderness  Follow ups:    Discharge Instructions:   Discharge Instructions     Call MD for:  difficulty breathing, headache or visual disturbances   Complete by: As directed    Call MD for:  extreme fatigue   Complete by: As directed    Call MD for:  hives   Complete by: As directed    Call MD for:  persistant dizziness or light-headedness   Complete by: As directed    Call MD for:  persistant nausea and vomiting   Complete by: As directed    Call MD for:  severe uncontrolled pain   Complete by: As directed    Call MD for:  temperature >100.4   Complete by: As directed    Diet general   Complete by: As directed    Discharge instructions   Complete by: As directed    Recommendations at discharge:   Complete 4 weeks course of ciprofloxacin with probiotics  Out of your multiple blood pressure medications, continue amlodipine and clonidine.  Continue to hold HCTZ, valsartan and Aldactone.  Continue to monitor blood pressure at home.  If systolic blood pressure is over 140, resume one medicine at a time.  Further adjustment may be needed as an outpatient under the care  of his PCP.  General discharge instructions: Follow with [facility] in [facility] days  Please request your PCP  to go over your hospital tests, procedures, radiology results at the follow up. Please get your medicines reviewed and adjusted.  Your PCP may decide to repeat certain labs or tests as needed. Do not drive, operate heavy machinery, perform activities at heights, swimming or participation in water activities or provide baby sitting services if your were admitted for syncope or siezures until you have seen by Primary MD or a Neurologist and advised to do so again. [place] Controlled Substance Reporting System database was reviewed. Do not drive, operate heavy machinery, perform activities at heights, swim, participate in water activities or provide baby-sitting services while on medications for pain, sleep and mood until your outpatient physician has reevaluated you and advised to do so again.  You are strongly recommended to comply with the dose, frequency and duration of prescribed medications. Activity: As tolerated with Full fall precautions use walker/cane & assistance as needed Avoid using any recreational substances like cigarette, tobacco, alcohol, or  non-prescribed drug. If you experience worsening of your admission symptoms, develop shortness of breath, life threatening emergency, suicidal or homicidal thoughts you must seek medical attention immediately by calling 911 or calling your MD immediately  if symptoms less severe. You must read complete instructions/literature along with all the possible adverse reactions/side effects for all the medicines you take and that have been prescribed to you. Take any new medicine only after you have completely understood and accepted all the possible adverse reactions/side effects.  Wear Seat belts while driving. You were cared for by a hospitalist during your hospital stay. If you have any questions about your discharge  medications or the care you received while you were in the hospital after you are discharged, you can call the unit and ask to speak with the hospitalist or the covering physician. Once you are discharged, your primary care physician will handle any further medical issues. Please note that NO REFILLS for any discharge medications will be authorized once you are discharged, as it is imperative that you return to your primary care physician (or establish a relationship with a primary care physician if you do not have one).   Increase activity slowly   Complete by: As directed        Discharge Medications:   Allergies as of 12/05/2022   No Known Allergies      Medication List     STOP taking these medications    hydrochlorothiazide 25 MG tablet Commonly known as: HYDRODIURIL   spironolactone 25 MG tablet Commonly known as: ALDACTONE   valsartan 320 MG tablet Commonly known as: DIOVAN       TAKE these medications    amLODipine 10 MG tablet Commonly known as: NORVASC Take 10 mg by mouth daily.   aspirin EC 81 MG tablet Take 1 tablet (81 mg total) by mouth daily. Swallow whole. What changed: additional instructions   Carboxymethylcellulose Sod PF 0.5 % Soln Place 1 drop into both eyes 4 (four) times daily as needed (for dryness).   ciprofloxacin 500 MG tablet Commonly known as: CIPRO Take 1 tablet (500 mg total) by mouth 2 (two) times daily for 28 days.   cloNIDine 0.1 MG tablet Commonly known as: CATAPRES Take 0.1 mg by mouth in the morning and at bedtime.   ondansetron 8 MG tablet Commonly known as: ZOFRAN Take 4 mg by mouth every 8 (eight) hours as needed for nausea or vomiting.   saccharomyces boulardii 250 MG capsule Commonly known as: FLORASTOR Take 1 capsule (250 mg total) by mouth 2 (two) times daily for 28 days.   traZODone 50 MG tablet Commonly known as: DESYREL Take 50 mg by mouth at bedtime as needed for sleep.         The results of  significant diagnostics from this hospitalization (including imaging, microbiology, ancillary and laboratory) are listed below for reference.    Procedures and Diagnostic Studies:   DG Chest Port 1 View  Result Date: 12/03/2022 CLINICAL DATA:  Pain and cold.  Viral symptoms. EXAM: PORTABLE CHEST 1 VIEW COMPARISON:  08/12/2022 FINDINGS: The patient is rotated to the right on today's radiograph, reducing diagnostic sensitivity and specificity. The lungs appear clear.  No blunting of the costophrenic angles. Severe chronic degenerative glenohumeral arthropathy, right greater than left. IMPRESSION: 1. No acute findings. 2. Severe chronic degenerative glenohumeral arthropathy, right greater than left. Electronically Signed   By: Gaylyn Rong M.D.   On: 12/03/2022 14:37   CT ABDOMEN PELVIS W CONTRAST  Result Date: 12/03/2022 CLINICAL DATA:  Pyelonephritis suspected, chills, urinary urgency and pain * Tracking Code: BO * EXAM: CT ABDOMEN AND PELVIS WITH CONTRAST TECHNIQUE: Multidetector CT imaging of the abdomen and pelvis was performed using the standard protocol following bolus administration of intravenous contrast. RADIATION DOSE REDUCTION: This exam was performed according to the departmental dose-optimization program which includes automated exposure control, adjustment of the mA and/or kV according to patient size and/or use of iterative reconstruction technique. CONTRAST:  OMNIPAQUE IOHEXOL 300 MG/ML  SOLN COMPARISON:  02/15/2022 FINDINGS: Lower chest: No acute abnormality. Coronary artery calcifications. Small hiatal hernia. Hepatobiliary: No solid liver abnormality is seen. Hepatic steatosis. No gallstones, gallbladder wall thickening, or biliary dilatation. Pancreas: Unremarkable. No pancreatic ductal dilatation or surrounding inflammatory changes. Spleen: Normal in size without significant abnormality. Adrenals/Urinary Tract: Adrenal glands are unremarkable. Continued interval enlargement  of an intermediate attenuation lesion of the anterior inferior pole of the right kidney adjacent to a larger, benign fluid attenuation cysts. Lesion measures 2.9 x 1.8 cm, previously 2.6 x 1.5 cm (series 2, image 50). The left kidney is normal. No calculi or hydronephrosis. Bladder is unremarkable. Stomach/Bowel: Stomach is within normal limits. Appendix appears normal. No evidence of bowel wall thickening, distention, or inflammatory changes. Descending and sigmoid diverticulosis. Vascular/Lymphatic: Aortic atherosclerosis. No enlarged abdominal or pelvic lymph nodes. Reproductive: Prostatomegaly. Fat stranding about the prostate (series 602, image 104). Other: Broad-based fat containing midline ventral hernia. No ascites. Musculoskeletal: No acute or significant osseous findings. IMPRESSION: 1. Prostatomegaly with fat stranding about the prostate, consistent with prostatitis. Correlate with urinalysis. 2. Continued interval enlargement of an intermediate attenuation lesion of the anterior inferior pole of the right kidney adjacent to a larger, benign fluid attenuation cyst. This may reflect a benign hemorrhagic or proteinaceous cyst but is incompletely characterized and remains suspicious for solid renal neoplasm, especially given continued enlargement over time. Recommend multiphasic contrast enhanced CT or MRI for further evaluation on a nonemergent, outpatient basis. 3. No CT evidence of pyelonephritis. 4. Descending and sigmoid diverticulosis without evidence of acute diverticulitis. 5. Hepatic steatosis. 6. Coronary artery disease. Aortic Atherosclerosis (ICD10-I70.0). Electronically Signed   By: Jearld Lesch M.D.   On: 12/03/2022 12:54     Labs:   Basic Metabolic Panel: Recent Labs  Lab 12/03/22 1030 12/04/22 0426 12/05/22 0437  NA 138 134* 136  K 3.7 3.4* 3.2*  CL 104 104 105  CO2 24 25 24   GLUCOSE 106* 103* 102*  BUN 25* 22 16  CREATININE 1.49* 1.37* 1.25*  CALCIUM 9.0 8.1* 8.1*    GFR Estimated Creatinine Clearance: 75.8 mL/min (A) (by C-G formula based on SCr of 1.25 mg/dL (H)). Liver Function Tests: Recent Labs  Lab 12/03/22 1030  AST 21  ALT 19  ALKPHOS 55  BILITOT 1.0  PROT 7.8  ALBUMIN 4.1   No results for input(s): "LIPASE", "AMYLASE" in the last 168 hours. No results for input(s): "AMMONIA" in the last 168 hours. Coagulation profile Recent Labs  Lab 12/03/22 1509  INR 1.2    CBC: Recent Labs  Lab 12/03/22 1030 12/04/22 0426 12/05/22 0437  WBC 14.3* 14.6* 11.3*  NEUTROABS 12.2*  --  8.9*  HGB 12.7* 11.0* 11.1*  HCT 38.1* 33.8* 34.1*  MCV 84.7 86.9 87.0  PLT 237 197 190   Cardiac Enzymes: No results for input(s): "CKTOTAL", "CKMB", "CKMBINDEX", "TROPONINI" in the last 168 hours. BNP: Invalid input(s): "POCBNP" CBG: No results for input(s): "GLUCAP" in the last 168 hours. D-Dimer No  results for input(s): "DDIMER" in the last 72 hours. Hgb A1c No results for input(s): "HGBA1C" in the last 72 hours. Lipid Profile No results for input(s): "CHOL", "HDL", "LDLCALC", "TRIG", "CHOLHDL", "LDLDIRECT" in the last 72 hours. Thyroid function studies No results for input(s): "TSH", "T4TOTAL", "T3FREE", "THYROIDAB" in the last 72 hours.  Invalid input(s): "FREET3" Anemia work up No results for input(s): "VITAMINB12", "FOLATE", "FERRITIN", "TIBC", "IRON", "RETICCTPCT" in the last 72 hours. Microbiology Recent Results (from the past 240 hour(s))  Urine Culture     Status: Abnormal   Collection Time: 12/03/22  9:47 AM   Specimen: Urine, Random  Result Value Ref Range Status   Specimen Description   Final    URINE, RANDOM Performed at [facility], [address] [place], [place] [address]    Special Requests   Final    NONE Reflexed from [id number] Performed at [facility], [address] [place], [place] [address]    Culture >=100,000 COLONIES/mL ESCHERICHIA COLI (A)  Final   Report Status 12/05/2022 FINAL  Final    Organism ID, Bacteria ESCHERICHIA COLI (A)  Final      Susceptibility   Escherichia coli - MIC*    AMPICILLIN >=32 RESISTANT Resistant     CEFAZOLIN >=64 RESISTANT Resistant     CEFEPIME <=0.12 SENSITIVE Sensitive     CEFTRIAXONE 16 RESISTANT Resistant     CIPROFLOXACIN <=0.25 SENSITIVE Sensitive     GENTAMICIN <=1 SENSITIVE Sensitive     IMIPENEM <=0.25 SENSITIVE Sensitive     NITROFURANTOIN <=16 SENSITIVE Sensitive     TRIMETH/SULFA <=20 SENSITIVE Sensitive     AMPICILLIN/SULBACTAM >=32 RESISTANT Resistant     PIP/TAZO >=128 RESISTANT Resistant     * >=100,000 COLONIES/mL ESCHERICHIA COLI  Culture, blood (routine x 2)     Status: None (Preliminary result)   Collection Time: 12/03/22 12:58 PM   Specimen: Left Antecubital; Blood  Result Value Ref Range Status   Specimen Description   Final    LEFT ANTECUBITAL Performed at [facility], [address] [place], [place] [address]    Special Requests   Final    BOTTLES DRAWN AEROBIC AND ANAEROBIC Blood Culture results may not be optimal due to an excessive volume of blood received in culture bottles Performed at [facility], [address] [place], [place] [address]    Culture   Final    NO GROWTH < 12 HOURS Performed at [facility], [address] [place], [place] [address]    Report Status PENDING  Incomplete  Culture, blood (routine x 2)     Status: None (Preliminary result)   Collection Time: 12/03/22  1:00 PM   Specimen: BLOOD RIGHT HAND  Result Value Ref Range Status   Specimen Description   Final    BLOOD RIGHT HAND Performed at [facility], [address] [place], [place] [address]    Special Requests   Final    BOTTLES DRAWN AEROBIC AND ANAEROBIC Blood Culture adequate volume Performed at [facility], [address] [place], [place] [address]    Culture   Final    NO GROWTH < 12 HOURS Performed at [facility], [address], [place], [place]  [address]    Report Status PENDING  Incomplete  SARS Coronavirus 2 by RT PCR (hospital order, performed in [facility] hospital lab) *cepheid single result test* Anterior  Nasal Swab     Status: None   Collection Time: 12/03/22  1:27 PM   Specimen: Anterior Nasal Swab  Result Value Ref Range Status   SARS Coronavirus 2 by RT PCR NEGATIVE NEGATIVE Final    Comment: (NOTE) SARS-CoV-2 target nucleic acids are NOT DETECTED.  The SARS-CoV-2 RNA is generally detectable in upper and lower respiratory specimens during the acute phase of infection. The lowest concentration of SARS-CoV-2 viral copies this assay can detect is 250 copies / mL. A negative result does not preclude SARS-CoV-2 infection and should not be used as the sole basis for treatment or other patient management decisions.  A negative result may occur with improper specimen collection / handling, submission of specimen other than nasopharyngeal swab, presence of viral mutation(s) within the areas targeted by this assay, and inadequate number of viral copies (<250 copies / mL). A negative result must be combined with clinical observations, patient history, and epidemiological information.  Fact Sheet for Patients:   RoadLapTop.co.za  Fact Sheet for Healthcare Providers: http://kim-miller.com/  This test is not yet approved or  cleared by the [place] FDA and has been authorized for detection and/or diagnosis of SARS-CoV-2 by FDA under an Emergency Use Authorization (EUA).  This EUA will remain in effect (meaning this test can be used) for the duration of the COVID-19 declaration under Section 564(b)(1) of the Act, 21 U.S.C. section 360bbb-3(b)(1), unless the authorization is terminated or revoked sooner.  Performed at [facility], [address] [place], [place] [address]     Time coordinating discharge: 45 minutes  Signed: Lorin Glass  [facility] 12/05/2022, 12:25 PM

## 2022-12-08 LAB — CULTURE, BLOOD (ROUTINE X 2)
Culture: NO GROWTH
Culture: NO GROWTH
Special Requests: ADEQUATE
